# Patient Record
Sex: Female | Born: 1985 | Race: White | Hispanic: No | Marital: Single | Smoking: Never smoker
Health system: Southern US, Community
[De-identification: ages and names within clinical notes are randomized; demographics above are authoritative.]

## PROBLEM LIST (undated history)

## (undated) DIAGNOSIS — F32A Depression, unspecified: Secondary | ICD-10-CM

## (undated) DIAGNOSIS — J45909 Unspecified asthma, uncomplicated: Secondary | ICD-10-CM

## (undated) DIAGNOSIS — E079 Disorder of thyroid, unspecified: Secondary | ICD-10-CM

## (undated) DIAGNOSIS — F419 Anxiety disorder, unspecified: Secondary | ICD-10-CM

## (undated) DIAGNOSIS — F1910 Other psychoactive substance abuse, uncomplicated: Secondary | ICD-10-CM

## (undated) DIAGNOSIS — F329 Major depressive disorder, single episode, unspecified: Secondary | ICD-10-CM

## (undated) DIAGNOSIS — E039 Hypothyroidism, unspecified: Secondary | ICD-10-CM

## (undated) HISTORY — PX: NO PAST SURGERIES: SHX2092

---

## 2011-02-19 ENCOUNTER — Other Ambulatory Visit (HOSPITAL_COMMUNITY)
Admission: RE | Admit: 2011-02-19 | Discharge: 2011-02-19 | Disposition: A | Discharge status: Home / Self Care | Payer: BC Managed Care – PPO | Source: Ambulatory Visit | Attending: Obstetrics and Gynecology | Admitting: Obstetrics and Gynecology

## 2011-02-19 ENCOUNTER — Other Ambulatory Visit: Payer: Self-pay | Admitting: Obstetrics and Gynecology

## 2011-02-19 DIAGNOSIS — N63 Unspecified lump in unspecified breast: Secondary | ICD-10-CM

## 2011-02-19 DIAGNOSIS — N760 Acute vaginitis: Secondary | ICD-10-CM | POA: Insufficient documentation

## 2011-02-19 DIAGNOSIS — Z113 Encounter for screening for infections with a predominantly sexual mode of transmission: Secondary | ICD-10-CM | POA: Insufficient documentation

## 2011-02-19 DIAGNOSIS — Z01419 Encounter for gynecological examination (general) (routine) without abnormal findings: Secondary | ICD-10-CM | POA: Insufficient documentation

## 2011-02-26 ENCOUNTER — Other Ambulatory Visit: Payer: Self-pay | Admitting: Obstetrics and Gynecology

## 2011-02-26 ENCOUNTER — Ambulatory Visit
Admission: RE | Admit: 2011-02-26 | Discharge: 2011-02-26 | Disposition: A | Discharge status: Home / Self Care | Payer: BC Managed Care – PPO | Source: Ambulatory Visit | Attending: Obstetrics and Gynecology | Admitting: Obstetrics and Gynecology

## 2011-02-26 DIAGNOSIS — N63 Unspecified lump in unspecified breast: Secondary | ICD-10-CM

## 2012-03-21 ENCOUNTER — Other Ambulatory Visit (HOSPITAL_COMMUNITY)
Admission: RE | Admit: 2012-03-21 | Discharge: 2012-03-21 | Disposition: A | Discharge status: Home / Self Care | Payer: BC Managed Care – PPO | Source: Ambulatory Visit | Attending: Obstetrics and Gynecology | Admitting: Obstetrics and Gynecology

## 2012-03-21 DIAGNOSIS — Z01419 Encounter for gynecological examination (general) (routine) without abnormal findings: Secondary | ICD-10-CM | POA: Insufficient documentation

## 2012-03-21 DIAGNOSIS — Z113 Encounter for screening for infections with a predominantly sexual mode of transmission: Secondary | ICD-10-CM | POA: Insufficient documentation

## 2012-03-22 ENCOUNTER — Other Ambulatory Visit: Payer: Self-pay | Admitting: Obstetrics and Gynecology

## 2012-03-22 DIAGNOSIS — N6009 Solitary cyst of unspecified breast: Secondary | ICD-10-CM

## 2012-04-04 ENCOUNTER — Ambulatory Visit
Admission: RE | Admit: 2012-04-04 | Discharge: 2012-04-04 | Disposition: A | Discharge status: Home / Self Care | Payer: BC Managed Care – PPO | Source: Ambulatory Visit | Attending: Obstetrics and Gynecology | Admitting: Obstetrics and Gynecology

## 2012-04-04 DIAGNOSIS — N6009 Solitary cyst of unspecified breast: Secondary | ICD-10-CM

## 2013-05-04 ENCOUNTER — Other Ambulatory Visit: Payer: Self-pay | Admitting: Obstetrics and Gynecology

## 2013-05-04 DIAGNOSIS — N6009 Solitary cyst of unspecified breast: Secondary | ICD-10-CM

## 2013-05-11 ENCOUNTER — Other Ambulatory Visit: Payer: Self-pay

## 2014-01-01 ENCOUNTER — Ambulatory Visit: Payer: Self-pay | Admitting: Podiatry

## 2014-01-07 NOTE — Patient Instructions (Signed)

## 2014-01-08 ENCOUNTER — Encounter: Payer: Self-pay | Admitting: Podiatry

## 2014-01-08 ENCOUNTER — Ambulatory Visit (INDEPENDENT_AMBULATORY_CARE_PROVIDER_SITE_OTHER): Payer: BC Managed Care – PPO | Admitting: Podiatry

## 2014-01-08 ENCOUNTER — Ambulatory Visit (INDEPENDENT_AMBULATORY_CARE_PROVIDER_SITE_OTHER): Payer: BC Managed Care – PPO

## 2014-01-08 VITALS — BP 115/72 | HR 81 | Resp 16 | Ht 66.0 in | Wt 180.0 lb

## 2014-01-08 DIAGNOSIS — M722 Plantar fascial fibromatosis: Secondary | ICD-10-CM

## 2014-01-08 MED ORDER — MELOXICAM 15 MG PO TABS: 15.0000 mg | ORAL_TABLET | Freq: Every day | ORAL | Status: DC

## 2014-01-08 MED ORDER — METHYLPREDNISOLONE (PAK) 4 MG PO TABS: ORAL_TABLET | ORAL | Status: DC

## 2014-01-08 NOTE — Progress Notes (Signed)
   Subjective:    Patient ID: Misty DavidsonHeather Jensen, female    DOB: May 04, 1985, 28 y.o.   MRN: [id number]  HPI Comments: "I have pain in the heel and side of my foot"  Patient c/o aching plantar heel and lateral side left foot for about 6 months. She does have AM pain. She is an active runner. She has tried new shoes, OTC orthotics, massaging, essential oils, some Ibuprofen. Not helping.  Foot Pain      Review of Systems  All other systems reviewed and are negative.      Objective:   Physical Exam: I reviewed her past medical history medications allergies surgery social history and review of systems. Pulses are followed palpable bilateral. Neurologic sensorium is intact her symptoms once the monofilament. Deep tendon reflexes intact bilateral muscle strength is 5 over 5 dorsiflexion plantar flexors and inverters everters all intrinsic musculature appears to be intact. Orthopedic evaluation demonstrates is pain on palpation medially located tubercle of the left heel as well as pain at the fourth and fifth metatarsocuboid articulation. Cutaneous evaluation demonstrates supple well-hydrated cutis no erythema or edema cellulitis drainage or odor. Radiographic evaluation demonstrates plantar distally oriented calcaneal heel spur with pain on palpation medial calcaneal tubercle of the left heel. Soft tissue increasing density at the plantar fascial calcaneal insertion site is indicative of plantar fasciitis.        Assessment & Plan:  Assessment: Plantar fasciitis left. Lateral compensatory syndrome left.  Plan: Discussed etiology pathology conservative versus surgical therapies. At this point injected her left heel today with Kenalog and local anesthetic. Put her in a plantar fascial brace and a night splint. Dispensed a prescription for a Medrol Dosepak to be followed by meloxicam. Discussed appropriate shoe gear stretching exercises ice therapy and shoe gear modifications.

## 2014-02-05 ENCOUNTER — Encounter: Payer: Self-pay | Admitting: Podiatry

## 2014-02-05 ENCOUNTER — Ambulatory Visit (INDEPENDENT_AMBULATORY_CARE_PROVIDER_SITE_OTHER): Payer: BC Managed Care – PPO | Admitting: Podiatry

## 2014-02-05 VITALS — BP 113/78 | HR 86 | Resp 16

## 2014-02-05 DIAGNOSIS — M722 Plantar fascial fibromatosis: Secondary | ICD-10-CM

## 2014-02-05 NOTE — Progress Notes (Signed)
She presents today for follow-up of plantar fasciitis to her left heel.  Objective: Vital signs are stable she is alert and oriented 3. Pulses are palpable left. She has pain on palpation medially located tubercle of the left heel.  Assessment: Plantar fasciitis left foot. Resolving at 50%.  Plan: Reinjected the left heel today continue all other conservative therapies follow up with me in 1 month we'll consider orthotics at that time.

## 2014-03-05 ENCOUNTER — Ambulatory Visit: Payer: BC Managed Care – PPO | Admitting: Podiatry

## 2014-06-25 ENCOUNTER — Other Ambulatory Visit (HOSPITAL_COMMUNITY)
Admission: RE | Admit: 2014-06-25 | Discharge: 2014-06-25 | Disposition: A | Discharge status: Home / Self Care | Payer: BLUE CROSS/BLUE SHIELD | Source: Ambulatory Visit | Attending: Obstetrics and Gynecology | Admitting: Obstetrics and Gynecology

## 2014-06-25 ENCOUNTER — Other Ambulatory Visit: Payer: Self-pay | Admitting: Obstetrics and Gynecology

## 2014-06-25 DIAGNOSIS — Z01411 Encounter for gynecological examination (general) (routine) with abnormal findings: Secondary | ICD-10-CM | POA: Insufficient documentation

## 2014-06-25 DIAGNOSIS — Z1151 Encounter for screening for human papillomavirus (HPV): Secondary | ICD-10-CM | POA: Insufficient documentation

## 2014-06-26 ENCOUNTER — Emergency Department (INDEPENDENT_AMBULATORY_CARE_PROVIDER_SITE_OTHER)
Admission: EM | Admit: 2014-06-26 | Discharge: 2014-06-26 | Disposition: A | Discharge status: Home / Self Care | Payer: Self-pay | Source: Home / Self Care | Attending: Emergency Medicine | Admitting: Emergency Medicine

## 2014-06-26 ENCOUNTER — Encounter (HOSPITAL_COMMUNITY): Payer: Self-pay | Admitting: Emergency Medicine

## 2014-06-26 DIAGNOSIS — N200 Calculus of kidney: Secondary | ICD-10-CM

## 2014-06-26 LAB — POCT URINALYSIS DIP (DEVICE)
BILIRUBIN URINE: NEGATIVE
Glucose, UA: NEGATIVE mg/dL
Ketones, ur: NEGATIVE mg/dL
NITRITE: NEGATIVE
PH: 6 (ref 5.0–8.0)
Protein, ur: 30 mg/dL — AB
Specific Gravity, Urine: 1.025 (ref 1.005–1.030)
Urobilinogen, UA: 0.2 mg/dL (ref 0.0–1.0)

## 2014-06-26 LAB — POCT PREGNANCY, URINE: Preg Test, Ur: NEGATIVE

## 2014-06-26 MED ORDER — HYDROCODONE-ACETAMINOPHEN 5-325 MG PO TABS
1.0000 | ORAL_TABLET | Freq: Once | ORAL | Status: AC
  Administered 2014-06-26: 1 via ORAL

## 2014-06-26 MED ORDER — IBUPROFEN 800 MG PO TABS
800.0000 mg | ORAL_TABLET | Freq: Three times a day (TID) | ORAL | Status: DC
Start: 2014-06-26 — End: 2016-06-12

## 2014-06-26 MED ORDER — HYDROCODONE-ACETAMINOPHEN 5-325 MG PO TABS
ORAL_TABLET | ORAL | Status: AC
  Filled 2014-06-26: qty 1

## 2014-06-26 MED ORDER — KETOROLAC TROMETHAMINE 60 MG/2ML IM SOLN
INTRAMUSCULAR | Status: AC
  Filled 2014-06-26: qty 2

## 2014-06-26 MED ORDER — HYDROCODONE-ACETAMINOPHEN 5-325 MG PO TABS
1.0000 | ORAL_TABLET | Freq: Four times a day (QID) | ORAL | Status: DC | PRN
Start: 2014-06-26 — End: 2016-06-12

## 2014-06-26 MED ORDER — KETOROLAC TROMETHAMINE 60 MG/2ML IM SOLN
60.0000 mg | Freq: Once | INTRAMUSCULAR | Status: AC
  Administered 2014-06-26: 60 mg via INTRAMUSCULAR

## 2014-06-26 MED ORDER — TAMSULOSIN HCL 0.4 MG PO CAPS: 0.4000 mg | ORAL_CAPSULE | Freq: Every day | ORAL | Status: DC

## 2014-06-26 MED ORDER — ONDANSETRON HCL 4 MG PO TABS: 4.0000 mg | ORAL_TABLET | Freq: Three times a day (TID) | ORAL | Status: DC | PRN

## 2014-06-26 NOTE — Discharge Instructions (Signed)
I think you have a kidney stone. Take ibuprofen 3 times a day for pain. Use the Norco every 4-6 hours as needed for severe pain. Take Zofran every 8 hours as needed for nausea. Take Flomax daily for the next week or until the stone has passed. Please strain your urine to try and collect the stone. Drink lots of fluids. If you cannot control the pain at home, you have uncontrolled vomiting, or you develop a fever [facility].

## 2014-06-26 NOTE — ED Notes (Signed)
Pt is having RLQ pain for about 24 hours.  Pt is very guarded with her abdomen, she is teary eyed, and is breathing heavy.  She states she is 7/10 pain, but has a high tolerance for pain.

## 2014-06-26 NOTE — ED Provider Notes (Signed)
CSN: [id number]     Arrival date & time 06/26/14  6213 History   First MD Initiated Contact with Patient 06/26/14 0827     Chief Complaint  Patient presents with  . Abdominal Pain   (Consider location/radiation/quality/duration/timing/severity/associated sxs/prior Treatment) HPI  He is a 29 year old woman here for evaluation of abdominal pain. She states this started yesterday. It is located in her right flank area. It does not radiate.  She states it is very hard for her to get comfortable. It is associated with nausea, but no vomiting. She states she had hot and cold chills last night, but did not check a temperature. She had a physical at her OB/GYN yesterday and was put on Cipro for a possible bladder infection as she had blood in her urine. She took some ibuprofen yesterday with minimal improvement. No medications this morning.  History reviewed. No pertinent past medical history. History reviewed. No pertinent past surgical history. History reviewed. No pertinent family history. History  Substance Use Topics  . Smoking status: Never Smoker   . Smokeless tobacco: Never Used  . Alcohol Use: Yes     Comment: occasional   OB History    No data available     Review of Systems  Constitutional: Positive for fever (subjective) and chills.  HENT: Negative.   Respiratory: Negative.   Cardiovascular: Negative.   Gastrointestinal: Positive for nausea, abdominal pain and diarrhea. Negative for vomiting.    Allergies  Codeine  Home Medications   Prior to Admission medications   Medication Sig Start Date End Date Taking? Authorizing Provider  Cetirizine HCl (ZYRTEC PO) Take by mouth.   Yes Historical Provider, MD  drospirenone-ethinyl estradiol (YAZ,GIANVI,LORYNA) 3-0.02 MG tablet Take 1 tablet by mouth daily.   Yes Historical Provider, MD  Escitalopram Oxalate (LEXAPRO PO) Take by mouth.   Yes Historical Provider, MD  Lansoprazole (PREVACID PO) Take by mouth.   Yes Historical  Provider, MD  methylPREDNIsolone (MEDROL DOSPACK) 4 MG tablet follow package directions 01/08/14  Yes Max T Hyatt, DPM  valACYclovir (VALTREX) 1000 MG tablet Take 1,000 mg by mouth 2 (two) times daily.   Yes Historical Provider, MD  HYDROcodone-acetaminophen (NORCO) 5-325 MG per tablet Take 1 tablet by mouth every 6 (six) hours as needed for moderate pain. 06/26/14   Charm Rings, MD  ibuprofen (ADVIL,MOTRIN) 800 MG tablet Take 1 tablet (800 mg total) by mouth 3 (three) times daily. 06/26/14   Charm Rings, MD  ondansetron (ZOFRAN) 4 MG tablet Take 1 tablet (4 mg total) by mouth every 8 (eight) hours as needed for nausea or vomiting. 06/26/14   Charm Rings, MD  tamsulosin (FLOMAX) 0.4 MG CAPS capsule Take 1 capsule (0.4 mg total) by mouth daily. 06/26/14   Charm Rings, MD  Triamcinolone Acetonide (NASACORT AQ NA) Place into the nose.    Historical Provider, MD   BP 125/82 mmHg  Pulse 110  Temp(Src) 98.2 F (36.8 C) (Oral)  Resp 22  SpO2 100%  LMP 05/26/2014 (Approximate) Physical Exam  Constitutional: She is oriented to person, place, and time. She appears well-developed and well-nourished. She appears distressed (Looks uncomfortable).  Neck: Neck supple.  Cardiovascular: Regular rhythm and normal heart sounds.  Tachycardia present.   Pulmonary/Chest: Effort normal and breath sounds normal. No respiratory distress. She has no wheezes. She has no rales.  Abdominal: Soft. Bowel sounds are normal. She exhibits no distension. There is tenderness (right side). There is rebound (pain with heel drop). There  is no guarding and no tenderness at McBurney's point.  Positive for right CVA tenderness  Neurological: She is alert and oriented to person, place, and time.    ED Course  Procedures (including critical care time) Labs Review Labs Reviewed  POCT URINALYSIS DIP (DEVICE) - Abnormal; Notable for the following:    Hgb urine dipstick TRACE (*)    Protein, ur 30 (*)    Leukocytes, UA SMALL (*)     All other components within normal limits  POCT PREGNANCY, URINE    Imaging Review No results found.   MDM   1. Right kidney stone    Toradol 60 mg IM and Norco 5-325 milligrams by mouth given.  Differential does include appendicitis, however no fever currently and pain would be an unusual location. Presentation is more consistent with kidney stone. Will treat with ibuprofen, Norco, Zofran, Flomax.  Urine strainer given. Return precautions reviewed.    Charm RingsErin J Aprille Sawhney, MD 06/26/14 [phone]

## 2014-06-27 LAB — CYTOLOGY - PAP

## 2014-07-08 ENCOUNTER — Other Ambulatory Visit: Payer: Self-pay | Admitting: Nurse Practitioner

## 2014-07-08 ENCOUNTER — Ambulatory Visit
Admission: RE | Admit: 2014-07-08 | Discharge: 2014-07-08 | Disposition: A | Discharge status: Home / Self Care | Payer: BLUE CROSS/BLUE SHIELD | Source: Ambulatory Visit | Attending: Nurse Practitioner | Admitting: Nurse Practitioner

## 2014-07-08 DIAGNOSIS — R1084 Generalized abdominal pain: Secondary | ICD-10-CM

## 2015-03-26 ENCOUNTER — Encounter (HOSPITAL_COMMUNITY): Payer: Self-pay | Admitting: Emergency Medicine

## 2015-03-26 ENCOUNTER — Emergency Department (HOSPITAL_COMMUNITY): Payer: BLUE CROSS/BLUE SHIELD

## 2015-03-26 ENCOUNTER — Emergency Department (HOSPITAL_COMMUNITY)
Admission: EM | Admit: 2015-03-26 | Discharge: 2015-03-26 | Disposition: A | Discharge status: Home / Self Care | Payer: BLUE CROSS/BLUE SHIELD | Attending: Emergency Medicine | Admitting: Emergency Medicine

## 2015-03-26 DIAGNOSIS — Z791 Long term (current) use of non-steroidal anti-inflammatories (NSAID): Secondary | ICD-10-CM | POA: Insufficient documentation

## 2015-03-26 DIAGNOSIS — Z79899 Other long term (current) drug therapy: Secondary | ICD-10-CM | POA: Insufficient documentation

## 2015-03-26 DIAGNOSIS — Z8639 Personal history of other endocrine, nutritional and metabolic disease: Secondary | ICD-10-CM | POA: Insufficient documentation

## 2015-03-26 DIAGNOSIS — Z7952 Long term (current) use of systemic steroids: Secondary | ICD-10-CM | POA: Insufficient documentation

## 2015-03-26 DIAGNOSIS — Z793 Long term (current) use of hormonal contraceptives: Secondary | ICD-10-CM | POA: Insufficient documentation

## 2015-03-26 DIAGNOSIS — R6889 Other general symptoms and signs: Secondary | ICD-10-CM

## 2015-03-26 DIAGNOSIS — R000 Tachycardia, unspecified: Secondary | ICD-10-CM | POA: Insufficient documentation

## 2015-03-26 DIAGNOSIS — M791 Myalgia: Secondary | ICD-10-CM | POA: Insufficient documentation

## 2015-03-26 DIAGNOSIS — R062 Wheezing: Secondary | ICD-10-CM | POA: Insufficient documentation

## 2015-03-26 DIAGNOSIS — R509 Fever, unspecified: Secondary | ICD-10-CM | POA: Insufficient documentation

## 2015-03-26 DIAGNOSIS — R0981 Nasal congestion: Secondary | ICD-10-CM | POA: Insufficient documentation

## 2015-03-26 HISTORY — DX: Disorder of thyroid, unspecified: E07.9

## 2015-03-26 MED ORDER — BENZONATATE 100 MG PO CAPS: 100.0000 mg | ORAL_CAPSULE | Freq: Three times a day (TID) | ORAL | Status: DC

## 2015-03-26 MED ORDER — HYDROCOD POLST-CPM POLST ER 10-8 MG/5ML PO SUER: 5.0000 mL | Freq: Every evening | ORAL | Status: DC | PRN

## 2015-03-26 MED ORDER — ALBUTEROL SULFATE HFA 108 (90 BASE) MCG/ACT IN AERS
2.0000 | INHALATION_SPRAY | Freq: Once | RESPIRATORY_TRACT | Status: AC
  Administered 2015-03-26: 2 via RESPIRATORY_TRACT
  Filled 2015-03-26: qty 6.7

## 2015-03-26 NOTE — Discharge Instructions (Signed)
Your symptoms are likely viral in nature and will resolve on their own over the next week. You may continue taking OTC medications for your symptoms. You may also try Sudafed to help with her congestion. Please take your cough medicine at night before bed as it does contain pain medicine they can make him very drowsy. Follow-up with your doctor as needed return to ED for any new or worsening symptoms.

## 2015-03-26 NOTE — ED Provider Notes (Signed)
CSN: [id number]     Arrival date & time 03/26/15  1729 History  By signing my name below, I, Evon Slackerrance Branch, attest that this documentation has been prepared under the direction and in the presence of [employer]jamin Tarl Cephas, PA-C. Electronically Signed: Evon Slackerrance Branch, ED Scribe. 03/26/2015. 7:07 PM.    Chief Complaint  Patient presents with  . Cough  . Generalized Body Aches    The history is provided by the patient. No language interpreter was used.   HPI Comments: Misty Jensen is a 30 y.o. female who presents to the Emergency Department complaining of fever onset 2 days prior. Pt states that she has associated congestion, productive cough, and generalized myalgias. Pt states that she has tried tylenol with no relief. Pt states she has temporary relief with ibuprofen. Pt states that she has as also tried dayquil, Nyquil, and delsym with no relief. Pt denies receiving influenza vaccination. Pt denies leg swelling or other related symptoms. Denies recent travel, surgeries, hemoptysis or leg swelling. Pt denies H Xof PE or DVT.    Past Medical History  Diagnosis Date  . Thyroid disease    History reviewed. No pertinent past surgical history. No family history on file. Social History  Substance Use Topics  . Smoking status: Never Smoker   . Smokeless tobacco: Never Used  . Alcohol Use: Yes     Comment: occasional   OB History    No data available     Review of Systems A complete 10 system review of systems was obtained and all systems are negative except as noted in the HPI and PMH.     Allergies  Sulfa antibiotics and Codeine  Home Medications   Prior to Admission medications   Medication Sig Start Date End Date Taking? Authorizing Provider  benzonatate (TESSALON) 100 MG capsule Take 1 capsule (100 mg total) by mouth every 8 (eight) hours. 03/26/15   Joycie PeekBenjamin Raniyah Curenton, PA-C  Cetirizine HCl (ZYRTEC PO) Take by mouth.    Historical Provider, MD  drospirenone-ethinyl  estradiol (YAZ,GIANVI,LORYNA) 3-0.02 MG tablet Take 1 tablet by mouth daily.    Historical Provider, MD  Escitalopram Oxalate (LEXAPRO PO) Take by mouth.    Historical Provider, MD  HYDROcodone-acetaminophen (NORCO) 5-325 MG per tablet Take 1 tablet by mouth every 6 (six) hours as needed for moderate pain. 06/26/14   Charm RingsErin J Honig, MD  ibuprofen (ADVIL,MOTRIN) 800 MG tablet Take 1 tablet (800 mg total) by mouth 3 (three) times daily. 06/26/14   Charm RingsErin J Honig, MD  Lansoprazole (PREVACID PO) Take by mouth.    Historical Provider, MD  methylPREDNIsolone (MEDROL DOSPACK) 4 MG tablet follow package directions 01/08/14   Max T Hyatt, DPM  ondansetron (ZOFRAN) 4 MG tablet Take 1 tablet (4 mg total) by mouth every 8 (eight) hours as needed for nausea or vomiting. 06/26/14   Charm RingsErin J Honig, MD  tamsulosin (FLOMAX) 0.4 MG CAPS capsule Take 1 capsule (0.4 mg total) by mouth daily. 06/26/14   Charm RingsErin J Honig, MD  Triamcinolone Acetonide (NASACORT AQ NA) Place into the nose.    Historical Provider, MD  valACYclovir (VALTREX) 1000 MG tablet Take 1,000 mg by mouth 2 (two) times daily.    Historical Provider, MD   BP 119/99 mmHg  Pulse 109  Temp(Src) 98 F (36.7 C) (Oral)  Resp 16  Ht 5\' 6"  (1.676 m)  Wt 72.576 kg  BMI 25.84 kg/m2  SpO2 97%  LMP 03/26/2015   Physical Exam  Constitutional: She is oriented to  person, place, and time. She appears well-developed and well-nourished. No distress.  HENT:  Head: Normocephalic and atraumatic.  Mouth/Throat: Oropharynx is clear and moist.  Eyes: Conjunctivae and EOM are normal.  Neck: Normal range of motion. Neck supple. No tracheal deviation present.  No meningismus or nuchal rigidity  Cardiovascular: Normal heart sounds.  Tachycardia present.   Mild tachycardia with normal heart sounds.   Pulmonary/Chest: Effort normal. No respiratory distress.  Mild wheezing LLL. No other adventitious lung sounds  Musculoskeletal: Normal range of motion. She exhibits no edema.  No  unilateral leg swelling  Neurological: She is alert and oriented to person, place, and time.  Skin: Skin is warm and dry.  Psychiatric: She has a normal mood and affect. Her behavior is normal.  Nursing note and vitals reviewed.   ED Course  Procedures (including critical care time) DIAGNOSTIC STUDIES: Oxygen Saturation is 97% on RA, normal by my interpretation.    COORDINATION OF CARE: 6:17 PM-Discussed treatment plan with pt at bedside and pt agreed to plan.    Labs Review Labs Reviewed - No data to display  Imaging Review Dg Chest 2 View  03/26/2015  CLINICAL DATA:  Cough since Monday.  Chills and fever. EXAM: CHEST  2 VIEW COMPARISON:  None. FINDINGS: The cardiac silhouette, mediastinal and hilar contours are within normal limits. Mild peribronchial thickening and slight increased interstitial markings which could suggest bronchitis. No infiltrates or effusions. The bony thorax is intact. IMPRESSION: Changes suggesting bronchitis.  No focal infiltrates. Electronically Signed   By: Rudie Meyer M.D.   On: 03/26/2015 18:31       EKG Interpretation None     Meds given in ED:  Medications  albuterol (PROVENTIL HFA;VENTOLIN HFA) 108 (90 Base) MCG/ACT inhaler 2 puff (2 puffs Inhalation Given 03/26/15 1903)    Discharge Medication List as of 03/26/2015  6:42 PM    START taking these medications   Details  benzonatate (TESSALON) 100 MG capsule Take 1 capsule (100 mg total) by mouth every 8 (eight) hours., Starting 03/26/2015, Until Discontinued, Print    chlorpheniramine-HYDROcodone (TUSSIONEX PENNKINETIC ER) 10-8 MG/5ML SUER Take 5 mLs by mouth at bedtime as needed for cough., Starting 03/26/2015, Until Discontinued, Print       Filed Vitals:   03/26/15 1740  BP: 119/99  Pulse: 109  Temp: 98 F (36.7 C)  TempSrc: Oral  Resp: 16  Height: 5\' 6"  (1.676 m)  Weight: 72.576 kg  SpO2: 97%    MDM  Patient with symptoms consistent with influenza.  Vitals are stable, mild  tachycardia.  No signs of dehydration, tolerating PO's.  Lungs are clear. Due to patient's presentation and physical exam a chest x-ray was ordered to rule out pneumonia.  Chest x-ray shows mild bronchitic changes. Patient given inhaler. Discussed the cost versus benefit of Tamiflu treatment with the patient.  The patient understands that symptoms are greater than the recommended 24-48 hour window of treatment.  Patient will be discharged with instructions to orally hydrate, rest, and use over-the-counter medications such as anti-inflammatories ibuprofen and Aleve for muscle aches and Tylenol for fever.  Patient will also be given a cough suppressant. Low suspicion at this time for other acute or emergent pathology such as PE, patient has low Wells score. Patient stable for discharge.  Final diagnoses:  Influenza-like symptoms      I personally performed the services described in this documentation, which was scribed in my presence. The recorded information has been reviewed and is  accurate.      Joycie Peek, PA-C 03/26/15 1907  Doug Sou, MD 03/27/15 [phone]

## 2015-03-26 NOTE — ED Notes (Signed)
Declined W/C at D/C and was escorted to lobby by RN. 

## 2015-03-26 NOTE — ED Notes (Signed)
See Pa Assessment

## 2015-03-26 NOTE — ED Notes (Signed)
Pt states for the last 3 days has been having a productive cough with body aches and chills. Denies and n/v/d.

## 2016-02-10 IMAGING — CT CT ABD-PELV W/O CM
2 of 4 series · 16 of 46 positions shown, 18 images · non-contrast
Comparison: None

CLINICAL DATA: Right flank pain for 3 weeks

EXAM:
CT ABDOMEN AND PELVIS WITHOUT CONTRAST
TECHNIQUE: Multidetector CT imaging of the abdomen and pelvis was performed
following the standard protocol without IV contrast.

[Series 2: renal stone 5.0 i41s 3 · axial · 0.76mm/px · z∈[-454,-34]mm · 13 of 92 slices shown, 15 images]
[im 4/92  soft-tissue]
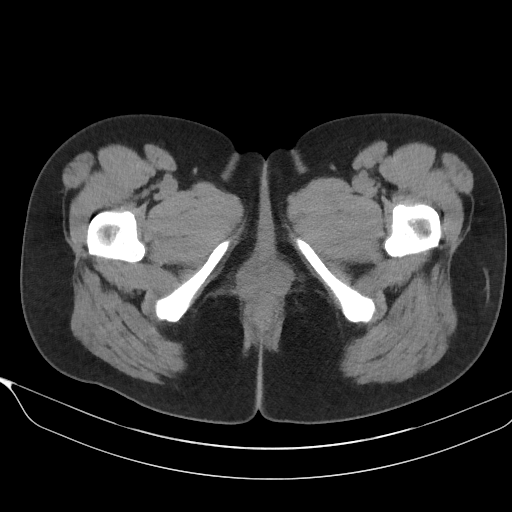
[im 4/92  bone]
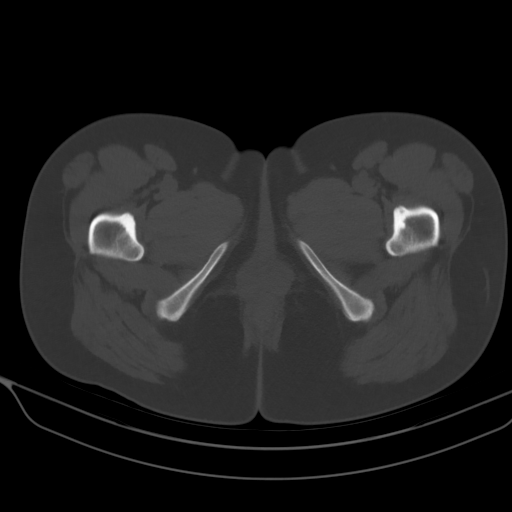
[im 11/92  soft-tissue]
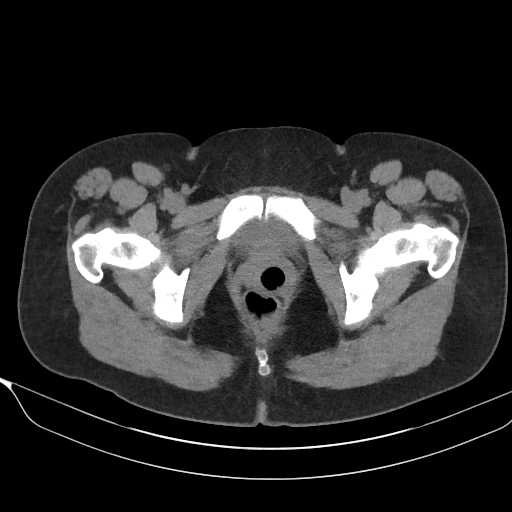
[im 18/92  soft-tissue]
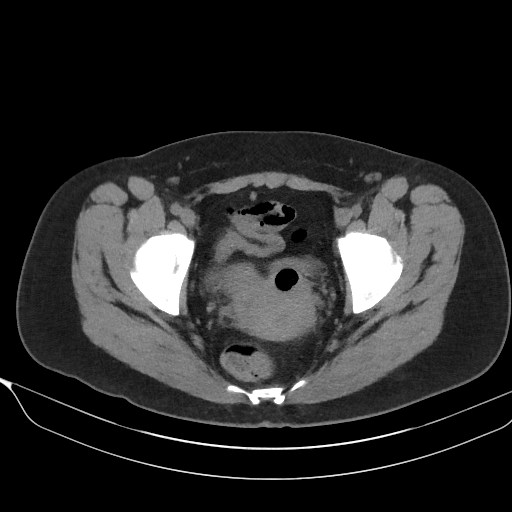
[im 25/92  soft-tissue]
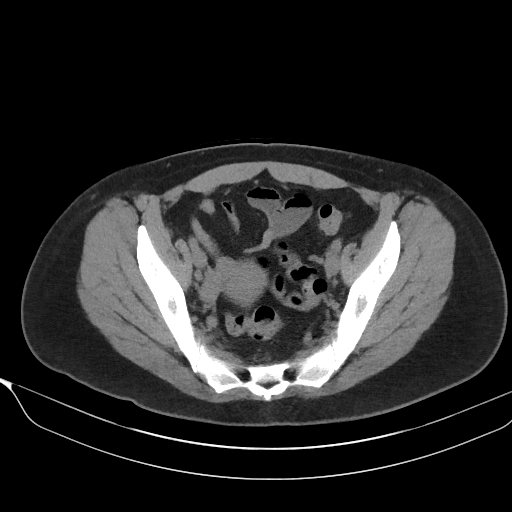
[im 32/92  soft-tissue]
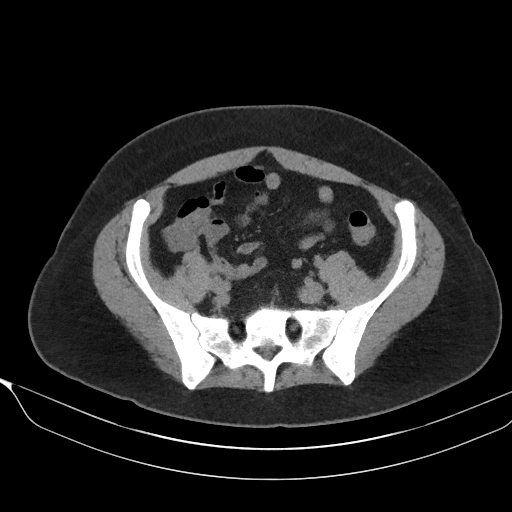
[im 39/92  soft-tissue]
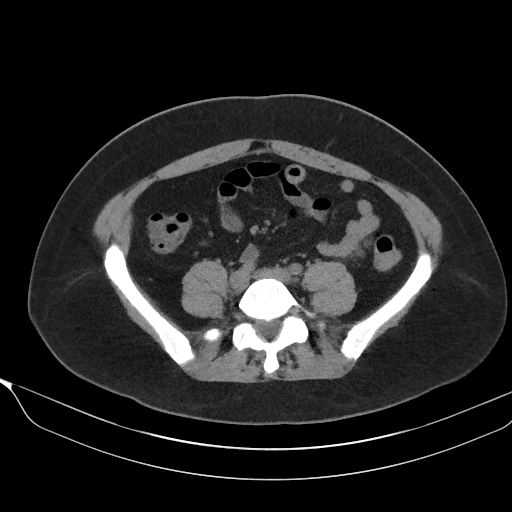
[im 46/92  soft-tissue]
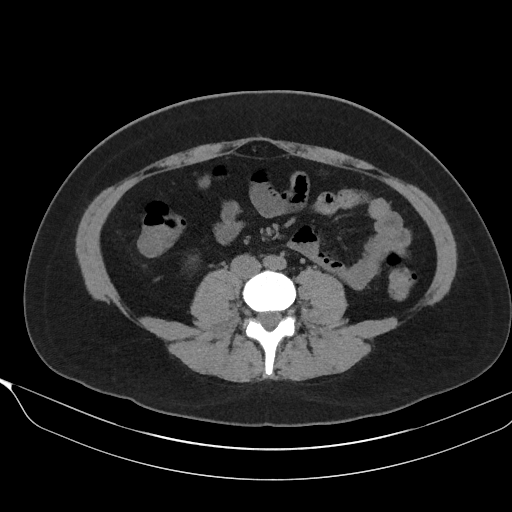
[im 53/92  soft-tissue]
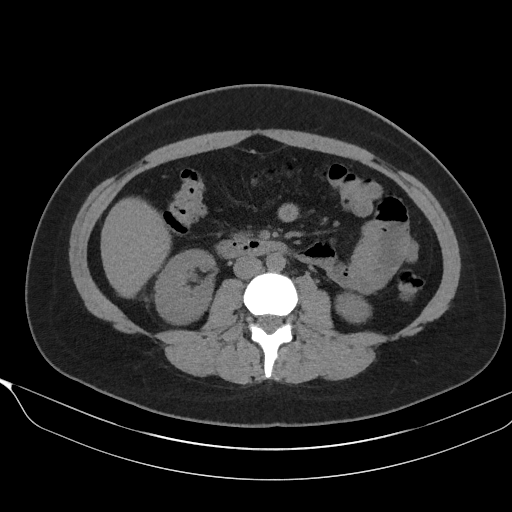
[im 60/92  soft-tissue]
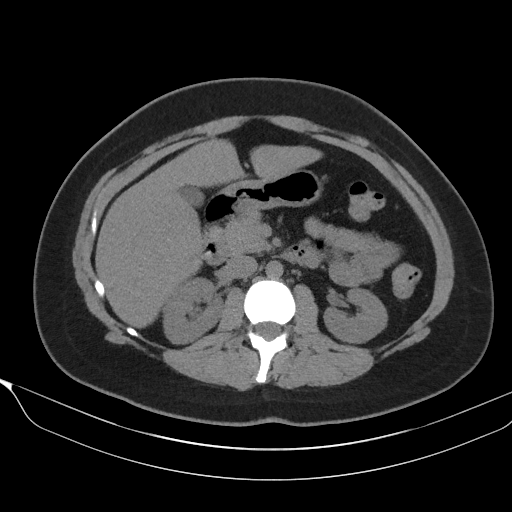
[im 60/92  bone]
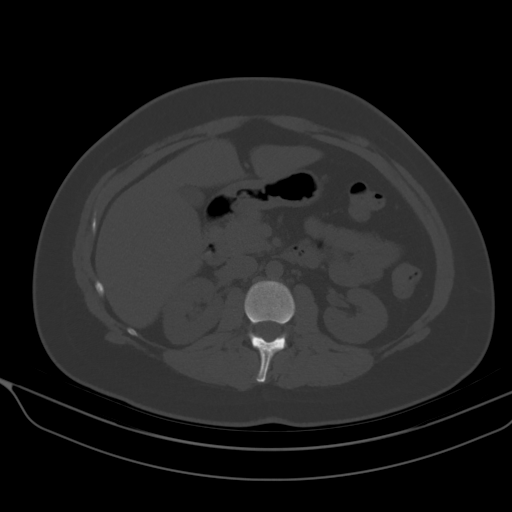
[im 67/92  soft-tissue]
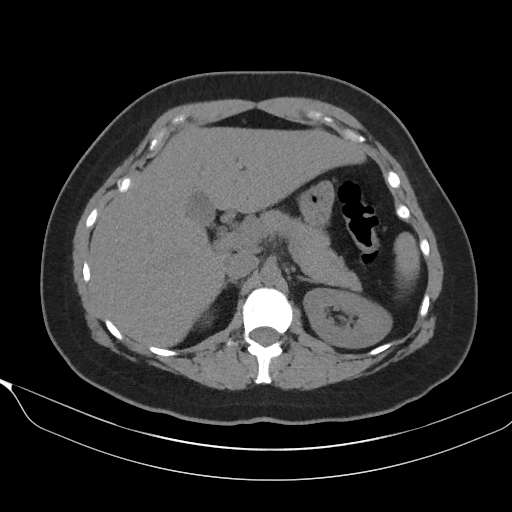
[im 74/92  soft-tissue]
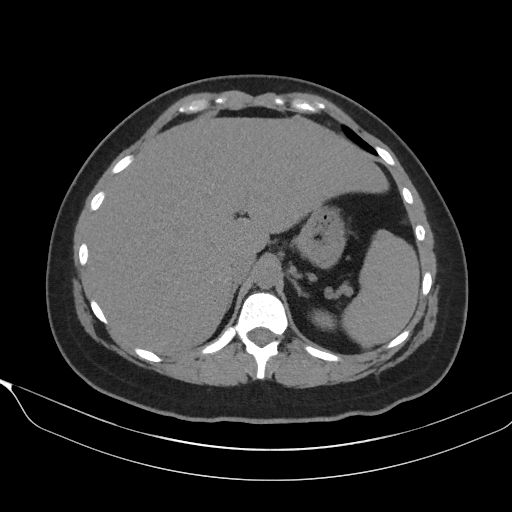
[im 81/92  soft-tissue]
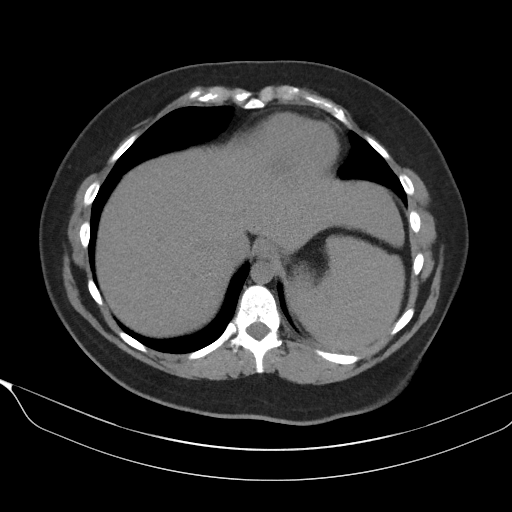
[im 88/92  soft-tissue]
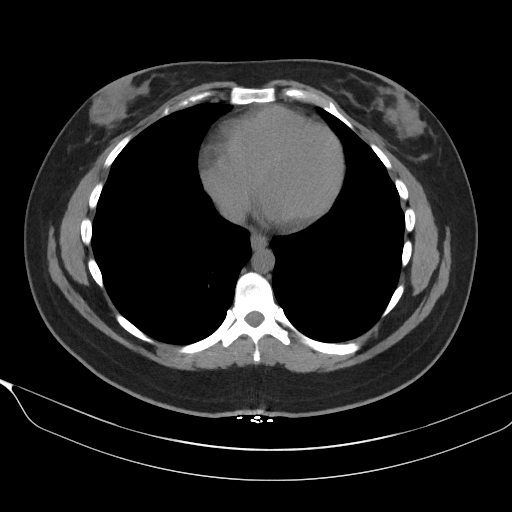

[Series 3: abd pelvis wo 2.0 cor · coronal · 0.71mm/px · 3 of 125 slices shown]
[im 42/125  soft-tissue]
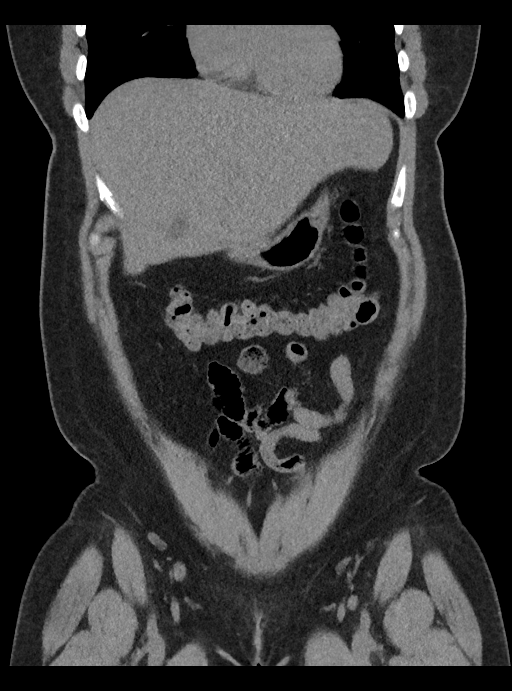
[im 56/125  soft-tissue]
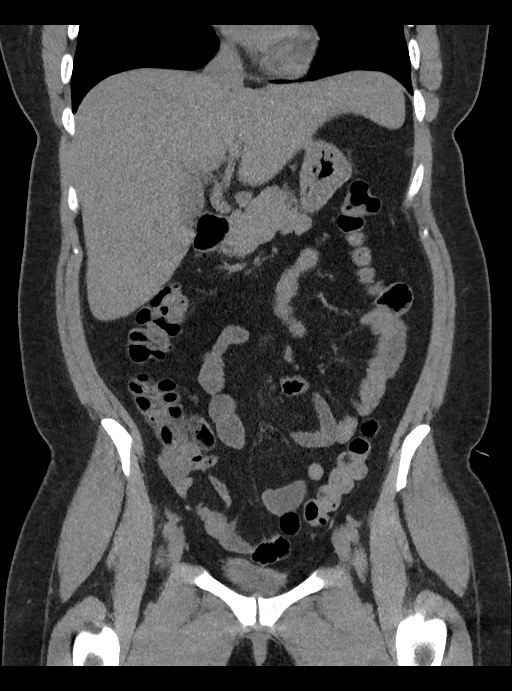
[im 69/125  soft-tissue]
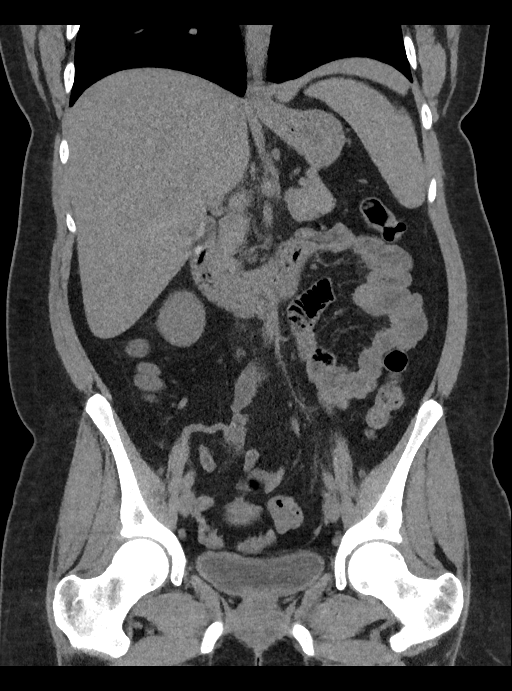

[16 of 46 positions shown; findings below may reference images not displayed]

FINDINGS: Lower chest: No pleural effusion. No pericardial effusion. The lung
bases appear clear.

Hepatobiliary: No suspicious liver abnormalities identified. The
gallbladder appears normal. There is no biliary dilatation.

Pancreas: Negative

Spleen: Negative

Adrenals/Urinary Tract: Normal adrenal glands. There are a few tiny
punctate stones identified within the right renal collecting system,
image number 93/series 3. Punctate stone within the inferior pole
collecting system of the left kidney is also noted, image 92/series
3. There is no hydronephrosis or hydroureter. No ureteral calculi
identified. Tiny calcification within the left posterior bladder
base is noted, image 79/series 2. This measures less than 3 mm.

Stomach/Bowel: The stomach is within normal limits. The small bowel
loops have a normal course and caliber. No obstruction. Normal
appearance of the colon.

Vascular/Lymphatic: Normal appearance of the abdominal aorta. No
enlarged retroperitoneal or mesenteric adenopathy. No enlarged
pelvic or inguinal lymph nodes.

Reproductive: The uterus and adnexal structures are unremarkable.

Other: There is no ascites or focal fluid collections within the
abdomen or pelvis.

Musculoskeletal: Review of the visualized osseous structures is
negative for aggressive lytic or sclerotic bone lesions.
IMPRESSION: 1. Tiny bilateral punctate renal calculi.
2. Tiny stone within the left posterior bladder base measures less
than 3 mm and may have recently passed accounting for the patient's
right flank pain.

## 2016-06-12 ENCOUNTER — Encounter (HOSPITAL_COMMUNITY): Payer: Self-pay

## 2016-06-12 ENCOUNTER — Encounter (HOSPITAL_BASED_OUTPATIENT_CLINIC_OR_DEPARTMENT_OTHER): Payer: Self-pay | Admitting: Emergency Medicine

## 2016-06-12 ENCOUNTER — Emergency Department (HOSPITAL_BASED_OUTPATIENT_CLINIC_OR_DEPARTMENT_OTHER)
Admission: EM | Admit: 2016-06-12 | Discharge: 2016-06-12 | Disposition: A | Discharge status: Other Acute Inpatient Hospital | Payer: BLUE CROSS/BLUE SHIELD | Attending: Emergency Medicine | Admitting: Emergency Medicine

## 2016-06-12 ENCOUNTER — Inpatient Hospital Stay (HOSPITAL_COMMUNITY)
Admission: AD | Admit: 2016-06-12 | Discharge: 2016-06-18 | DRG: 885 | Disposition: A | Discharge status: Other Acute Inpatient Hospital | Payer: No Typology Code available for payment source | Source: Intra-hospital | Attending: Psychiatry | Admitting: Psychiatry

## 2016-06-12 DIAGNOSIS — Z560 Unemployment, unspecified: Secondary | ICD-10-CM

## 2016-06-12 DIAGNOSIS — Z5181 Encounter for therapeutic drug level monitoring: Secondary | ICD-10-CM | POA: Insufficient documentation

## 2016-06-12 DIAGNOSIS — Z882 Allergy status to sulfonamides status: Secondary | ICD-10-CM

## 2016-06-12 DIAGNOSIS — G4700 Insomnia, unspecified: Secondary | ICD-10-CM | POA: Diagnosis present

## 2016-06-12 DIAGNOSIS — F1490 Cocaine use, unspecified, uncomplicated: Secondary | ICD-10-CM | POA: Insufficient documentation

## 2016-06-12 DIAGNOSIS — F332 Major depressive disorder, recurrent severe without psychotic features: Principal | ICD-10-CM | POA: Diagnosis present

## 2016-06-12 DIAGNOSIS — F1420 Cocaine dependence, uncomplicated: Secondary | ICD-10-CM | POA: Diagnosis present

## 2016-06-12 DIAGNOSIS — F1910 Other psychoactive substance abuse, uncomplicated: Secondary | ICD-10-CM

## 2016-06-12 DIAGNOSIS — F1994 Other psychoactive substance use, unspecified with psychoactive substance-induced mood disorder: Secondary | ICD-10-CM | POA: Diagnosis not present

## 2016-06-12 DIAGNOSIS — Z886 Allergy status to analgesic agent status: Secondary | ICD-10-CM

## 2016-06-12 DIAGNOSIS — E039 Hypothyroidism, unspecified: Secondary | ICD-10-CM | POA: Diagnosis present

## 2016-06-12 DIAGNOSIS — J45909 Unspecified asthma, uncomplicated: Secondary | ICD-10-CM | POA: Insufficient documentation

## 2016-06-12 DIAGNOSIS — R45851 Suicidal ideations: Secondary | ICD-10-CM | POA: Diagnosis present

## 2016-06-12 DIAGNOSIS — F1290 Cannabis use, unspecified, uncomplicated: Secondary | ICD-10-CM | POA: Insufficient documentation

## 2016-06-12 DIAGNOSIS — Z79899 Other long term (current) drug therapy: Secondary | ICD-10-CM | POA: Diagnosis not present

## 2016-06-12 HISTORY — DX: Depression, unspecified: F32.A

## 2016-06-12 HISTORY — DX: Other psychoactive substance abuse, uncomplicated: F19.10

## 2016-06-12 HISTORY — DX: Hypothyroidism, unspecified: E03.9

## 2016-06-12 HISTORY — DX: Major depressive disorder, single episode, unspecified: F32.9

## 2016-06-12 HISTORY — DX: Anxiety disorder, unspecified: F41.9

## 2016-06-12 HISTORY — DX: Unspecified asthma, uncomplicated: J45.909

## 2016-06-12 LAB — COMPREHENSIVE METABOLIC PANEL
ALT: 23 U/L (ref 14–54)
AST: 24 U/L (ref 15–41)
Albumin: 4.5 g/dL (ref 3.5–5.0)
Alkaline Phosphatase: 62 U/L (ref 38–126)
Anion gap: 11 (ref 5–15)
BILIRUBIN TOTAL: 1 mg/dL (ref 0.3–1.2)
BUN: 9 mg/dL (ref 6–20)
CALCIUM: 9.7 mg/dL (ref 8.9–10.3)
CO2: 23 mmol/L (ref 22–32)
CREATININE: 0.87 mg/dL (ref 0.44–1.00)
Chloride: 105 mmol/L (ref 101–111)
GFR calc Af Amer: >60 mL/min (ref >60)
GFR calc non Af Amer: >60 mL/min (ref >60)
GLUCOSE: 85 mg/dL (ref 65–99)
Potassium: 3.5 mmol/L (ref 3.5–5.1)
SODIUM: 139 mmol/L (ref 135–145)
TOTAL PROTEIN: 7.8 g/dL (ref 6.5–8.1)

## 2016-06-12 LAB — HCG, QUANTITATIVE, PREGNANCY

## 2016-06-12 LAB — RAPID URINE DRUG SCREEN, HOSP PERFORMED
Amphetamines: POSITIVE — AB
BARBITURATES: NOT DETECTED
Benzodiazepines: POSITIVE — AB
COCAINE: POSITIVE — AB
Opiates: NOT DETECTED
Tetrahydrocannabinol: POSITIVE — AB

## 2016-06-12 LAB — ACETAMINOPHEN LEVEL

## 2016-06-12 LAB — CBC
HEMATOCRIT: 39.7 % (ref 36.0–46.0)
Hemoglobin: 14 g/dL (ref 12.0–15.0)
MCH: 31.8 pg (ref 26.0–34.0)
MCHC: 35.3 g/dL (ref 30.0–36.0)
MCV: 90.2 fL (ref 78.0–100.0)
Platelets: 326 10*3/uL (ref 150–400)
RBC: 4.4 MIL/uL (ref 3.87–5.11)
RDW: 12.4 % (ref 11.5–15.5)
WBC: 8.9 10*3/uL (ref 4.0–10.5)

## 2016-06-12 LAB — ETHANOL: Alcohol, Ethyl (B): <5 mg/dL (ref <5)

## 2016-06-12 LAB — SALICYLATE LEVEL: Salicylate Lvl: <7 mg/dL (ref 2.8–30.0)

## 2016-06-12 MED ORDER — ACETAMINOPHEN 325 MG PO TABS
650.0000 mg | ORAL_TABLET | Freq: Four times a day (QID) | ORAL | Status: DC | PRN
  Administered 2016-06-13: 650 mg via ORAL
  Filled 2016-06-12: qty 2

## 2016-06-12 MED ORDER — HYDROXYZINE HCL 25 MG PO TABS
25.0000 mg | ORAL_TABLET | Freq: Three times a day (TID) | ORAL | Status: DC | PRN
  Administered 2016-06-12 – 2016-06-13 (×2): 25 mg via ORAL
  Filled 2016-06-12 (×2): qty 1

## 2016-06-12 MED ORDER — MAGNESIUM HYDROXIDE 400 MG/5ML PO SUSP: 30.0000 mL | Freq: Every day | ORAL | Status: DC | PRN

## 2016-06-12 MED ORDER — TRAZODONE HCL 50 MG PO TABS
50.0000 mg | ORAL_TABLET | Freq: Every evening | ORAL | Status: DC | PRN
  Administered 2016-06-12 – 2016-06-17 (×6): 50 mg via ORAL
  Filled 2016-06-12 (×17): qty 1

## 2016-06-12 MED ORDER — ALUM & MAG HYDROXIDE-SIMETH 200-200-20 MG/5ML PO SUSP: 15.0000 mL | ORAL | Status: DC | PRN

## 2016-06-12 NOTE — Tx Team (Signed)
Initial Treatment Plan 06/12/2016 11:48 PM Ameshia Fayson [id number]    PATIENT STRESSORS: Financial difficulties Health problems Legal issue Medication change or noncompliance Occupational concerns Substance abuse   PATIENT STRENGTHS: Ability for insight Average or above average intelligence Capable of independent living Communication skills General fund of knowledge Motivation for treatment/growth Supportive family/friends   PATIENT IDENTIFIED PROBLEMS: "think more positive about myself"  "stop hating myself"  "get back the things I have lost"  "get on medications that help"  Depression  Suicide Risk  Substance Abuse         DISCHARGE CRITERIA:  Ability to meet basic life and health needs Adequate post-discharge living arrangements Improved stabilization in mood, thinking, and/or behavior Medical problems require only outpatient monitoring Motivation to continue treatment in a less acute level of care Need for constant or close observation no longer present Reduction of life-threatening or endangering symptoms to within safe limits Safe-care adequate arrangements made Verbal commitment to aftercare and medication compliance Withdrawal symptoms are absent or subacute and managed without 24-hour nursing intervention  PRELIMINARY DISCHARGE PLAN: Outpatient therapy  PATIENT/FAMILY INVOLVEMENT: This treatment plan has been presented to and reviewed with the patient, Aerabella Koos.  The patient and family have been given the opportunity to ask questions and make suggestions.  Ferrel Logan, RN 06/12/2016, 11:48 PM

## 2016-06-12 NOTE — ED Notes (Signed)
Called staffing office for sitter.   

## 2016-06-12 NOTE — ED Notes (Signed)
No changes, alert, NAD, calm, interactive, pleasant, tearful, speaking with mother at [facility]. Updated with, "bed ready after 2100, going to [facility] [phone], accepted by Dr. Jama Flavors".

## 2016-06-12 NOTE — ED Notes (Signed)
Admits to "just taking zyrtec at this time, not taking anything in awhile for hyperthyroid, last lexapro months ago". Pt updated with status, plan, timeframe, agreeable, pleasant.
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
## 2016-06-12 NOTE — BH Assessment (Signed)
Pt accepted to [facility] after 21:00 on 06/12/16. Report given to Betti Cruz, RN. Call to report [id number]. Attending physician is Dr. Jama Flavors, MD. Accepting provider Dr. Thressa Sheller.  Princess Bruins, LCSWA TTS Specialist
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
## 2016-06-12 NOTE — ED Notes (Signed)
Alert, NAD, calm, interactive, resps e/u, speaking in clear complete sentences, no dyspnea noted. Family at BS. 

## 2016-06-12 NOTE — ED Notes (Signed)
No changes, ambulatory out with RN to [facility] Passy, steady gait. Denies questions or needs.
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
## 2016-06-13 DIAGNOSIS — F332 Major depressive disorder, recurrent severe without psychotic features: Principal | ICD-10-CM

## 2016-06-13 DIAGNOSIS — R45851 Suicidal ideations: Secondary | ICD-10-CM

## 2016-06-13 DIAGNOSIS — Z79899 Other long term (current) drug therapy: Secondary | ICD-10-CM

## 2016-06-13 MED ORDER — SERTRALINE HCL 25 MG PO TABS
25.0000 mg | ORAL_TABLET | Freq: Once | ORAL | Status: AC
  Administered 2016-06-13: 25 mg via ORAL
  Filled 2016-06-13 (×2): qty 1

## 2016-06-13 MED ORDER — CHLORDIAZEPOXIDE HCL 25 MG PO CAPS
25.0000 mg | ORAL_CAPSULE | Freq: Four times a day (QID) | ORAL | Status: AC | PRN
  Administered 2016-06-14 (×2): 25 mg via ORAL
  Filled 2016-06-13 (×2): qty 1

## 2016-06-13 MED ORDER — CHLORDIAZEPOXIDE HCL 25 MG PO CAPS: 25.0000 mg | ORAL_CAPSULE | Freq: Three times a day (TID) | ORAL | Status: DC

## 2016-06-13 MED ORDER — VITAMIN B-1 100 MG PO TABS
100.0000 mg | ORAL_TABLET | Freq: Every day | ORAL | Status: DC
  Administered 2016-06-13 – 2016-06-18 (×6): 100 mg via ORAL
  Filled 2016-06-13 (×8): qty 1

## 2016-06-13 MED ORDER — LOPERAMIDE HCL 2 MG PO CAPS: 2.0000 mg | ORAL_CAPSULE | ORAL | Status: AC | PRN

## 2016-06-13 MED ORDER — ADULT MULTIVITAMIN W/MINERALS CH
1.0000 | ORAL_TABLET | Freq: Every day | ORAL | Status: DC
  Administered 2016-06-13 – 2016-06-18 (×6): 1 via ORAL
  Filled 2016-06-13 (×8): qty 1

## 2016-06-13 MED ORDER — CHLORDIAZEPOXIDE HCL 25 MG PO CAPS: 25.0000 mg | ORAL_CAPSULE | Freq: Every day | ORAL | Status: DC

## 2016-06-13 MED ORDER — ONDANSETRON 4 MG PO TBDP
4.0000 mg | ORAL_TABLET | Freq: Four times a day (QID) | ORAL | Status: AC | PRN
  Administered 2016-06-13 – 2016-06-15 (×3): 4 mg via ORAL
  Filled 2016-06-13 (×3): qty 1

## 2016-06-13 MED ORDER — SERTRALINE HCL 50 MG PO TABS
50.0000 mg | ORAL_TABLET | Freq: Every day | ORAL | Status: DC
  Administered 2016-06-14 – 2016-06-15 (×2): 50 mg via ORAL
  Filled 2016-06-13 (×4): qty 1

## 2016-06-13 MED ORDER — CHLORDIAZEPOXIDE HCL 25 MG PO CAPS: 25.0000 mg | ORAL_CAPSULE | ORAL | Status: DC

## 2016-06-13 MED ORDER — CHLORDIAZEPOXIDE HCL 25 MG PO CAPS
25.0000 mg | ORAL_CAPSULE | Freq: Four times a day (QID) | ORAL | Status: DC
  Administered 2016-06-13: 25 mg via ORAL
  Filled 2016-06-13: qty 1
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
## 2016-06-13 NOTE — Progress Notes (Signed)
Nursing Progress Note 7p-7a  D) Patient provided a [place] sandwich and ginger ale. Patient visited with mom for 10 minutes on the unit after 2300 in the dayroom. Patient requested medication for sleep and anxiety.   A) Emotional support given. 1:1 interaction and active listening provided. Patient medicated with PM orders as prescribed. Medications reviewed with patient. Patient verbalized understanding of medications without further questions.  Opportunities for questions or concerns presented to patient. Patient encouraged to continue to work on treatment goals. Labs, vital signs and patient behavior monitored throughout shift. Patient safety maintained with q15 min safety checks.  R) Patient receptive to interaction with nurse. Patient and mother both tearful during visit.  Patient denies any adverse medication reactions at this time. Patient is resting in bed without complaints and remains safe on the unit at this time. Will continue to monitor.

## 2016-06-13 NOTE — [facility] Group Notes (Signed)
[facility] LCSW Group Therapy Note   06/13/2016  10:10 - 11:10 AM   Type of Therapy and Topic: Group Therapy: Feelings Around Returning Home & Establishing a Supportive Framework and Activity to Identify signs of Improvement or Decompensation   Participation Level: Did Not Attend; invited to participate yet did not despite overhead announcement and encouragement by staff   Description of Group:  Patients first processed thoughts and feelings about up coming discharge. These included fears of upcoming changes, lack of change, new living environments, judgements and expectations from others and overall stigma of MH issues. We then discussed what is a supportive framework? What does it look like feel like and how do I discern it from and unhealthy non-supportive network? Learn how to cope when supports are not helpful and don't support you. Discuss what to do when your family/friends are not supportive.   Catherine C Harrill, LCSW    

## 2016-06-13 NOTE — [facility] Group Notes (Signed)
Patient did not attend AA group. 

## 2016-06-13 NOTE — Progress Notes (Signed)
D: Pt presents with flat affect and depressed mood. Pt rates depression 8/10. Anxiety 9/10. Pt sad and tearful on approach. Pt appears guarded and withdrawn. Pt reported that she was taking xanax daily. Pt hypotensive this morning. Fluid encouraged and b/p monitored.  A: Orders reviewed with pt. Verbal support provided. Pt encouraged to attend groups. 15 minute checks performed for safety.  R: Pt receptive to tx.

## 2016-06-13 NOTE — Plan of Care (Signed)
Problem: Safety: Goal: Periods of time without injury will increase Outcome: Progressing Patient verbally contracts for safety on the unit and is on q15 minute safety checks.

## 2016-06-13 NOTE — Progress Notes (Signed)
Date:  06/13/2016 Time:  1130 am  Group Topic/Focus:  Coping With Mental Health Crisis:   The purpose of this group is to help patients identify strategies for coping with mental health crisis.  Group discusses possible causes of crisis and ways to manage them effectively.  Participation Level:  Did Not Attend  Participation Quality:    Affect:    Cognitive:    Insight:   Engagement in Group:    Modes of Intervention:    Additional Comments:    Tawonna Esquer L 06/13/2016, 3:02 PM  

## 2016-06-14 DIAGNOSIS — F1994 Other psychoactive substance use, unspecified with psychoactive substance-induced mood disorder: Secondary | ICD-10-CM | POA: Diagnosis present

## 2016-06-14 MED ORDER — CLONIDINE HCL 0.1 MG PO TABS: 0.1000 mg | ORAL_TABLET | Freq: Every day | ORAL | Status: DC

## 2016-06-14 MED ORDER — IBUPROFEN 800 MG PO TABS
800.0000 mg | ORAL_TABLET | Freq: Three times a day (TID) | ORAL | Status: DC | PRN
  Administered 2016-06-14 – 2016-06-17 (×4): 800 mg via ORAL
  Filled 2016-06-14 (×4): qty 1

## 2016-06-14 MED ORDER — CLONIDINE HCL 0.1 MG PO TABS
0.1000 mg | ORAL_TABLET | Freq: Four times a day (QID) | ORAL | Status: DC
  Filled 2016-06-14 (×9): qty 1

## 2016-06-14 MED ORDER — DICYCLOMINE HCL 20 MG PO TABS
20.0000 mg | ORAL_TABLET | Freq: Four times a day (QID) | ORAL | Status: DC | PRN
  Administered 2016-06-15: 20 mg via ORAL
  Filled 2016-06-14: qty 1

## 2016-06-14 MED ORDER — ARIPIPRAZOLE 2 MG PO TABS
2.0000 mg | ORAL_TABLET | Freq: Every day | ORAL | Status: DC
  Administered 2016-06-14: 2 mg via ORAL
  Filled 2016-06-14 (×3): qty 1

## 2016-06-14 MED ORDER — METHOCARBAMOL 500 MG PO TABS: 500.0000 mg | ORAL_TABLET | Freq: Three times a day (TID) | ORAL | Status: DC | PRN

## 2016-06-14 MED ORDER — ARIPIPRAZOLE 2 MG PO TABS
2.0000 mg | ORAL_TABLET | Freq: Every day | ORAL | Status: DC
  Administered 2016-06-15 – 2016-06-18 (×4): 2 mg via ORAL
  Filled 2016-06-14 (×5): qty 1

## 2016-06-14 MED ORDER — CLONIDINE HCL 0.1 MG PO TABS
0.1000 mg | ORAL_TABLET | ORAL | Status: DC
  Filled 2016-06-14: qty 1

## 2016-06-14 MED ORDER — HYDROXYZINE HCL 25 MG PO TABS
25.0000 mg | ORAL_TABLET | Freq: Four times a day (QID) | ORAL | Status: DC | PRN
  Administered 2016-06-15: 25 mg via ORAL
  Filled 2016-06-14: qty 1

## 2016-06-14 MED ORDER — DULOXETINE HCL 20 MG PO CPEP: 20.0000 mg | ORAL_CAPSULE | Freq: Two times a day (BID) | ORAL | Status: DC

## 2016-06-14 NOTE — Progress Notes (Signed)
Recreation Therapy Notes  Date: 06/14/16 Time: 0930 Location: [address] Dayroom  Group Topic: Stress Management  Goal Area(s) Addresses:  Patient will verbalize importance of using healthy stress management.  Patient will identify positive emotions associated with healthy stress management.   Intervention: Stress Management  Activity :  LRT introduced the stress management technique of guided imagery.  LRT read a script to engage patients in guided imagery.  Patients were to follow along as the script was read in order to fully participate in the technique.  Education:  Stress Management, Discharge Planning.   Education Outcome: Acknowledges edcuation/In group clarification offered/Needs additional education  Clinical Observations/Feedback: Pt did not attend group.   Misty Jensen, LRT/CTRS         Misty Jensen A 06/14/2016 12:04 PM 

## 2016-06-14 NOTE — Progress Notes (Signed)
Nutrition Brief Note  Patient identified on the Malnutrition Screening Tool (MST) Report  Pt with insignificant weight loss.   Wt Readings from Last 15 Encounters:  06/12/16 140 lb (63.5 kg)  06/12/16 135 lb (61.2 kg)  03/26/15 160 lb (72.6 kg)  01/08/14 180 lb (81.6 kg)    Body mass index is 22.6 kg/m. Patient meets criteria for normal based on current BMI.  Labs and medications reviewed.   No nutrition interventions warranted at this time. If nutrition issues arise, please consult RD.   Tilda Franco, MS, RD, LDN Pager: [phone] After Hours Pager: [phone]
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
## 2016-06-14 NOTE — Progress Notes (Signed)
D: Patient pleasant and cooperative with care this shift, but is noted to have a flat affect endorsing depression. Pt with minimal interaction with staff/peers. A: Encourage staff/peer interaction, medication compliance, and group participation. Administer medications as ordered, maintain Q 15 minute safety checks. R: Pt compliant with medications. Pt denies SI at this time and verbally contracts for safety. No signs/symptoms of distress noted.

## 2016-06-14 NOTE — Tx Team (Signed)
Interdisciplinary Treatment and Diagnostic Plan Update  06/14/2016 Time of Session: 0930 Misty Jensen MRN: [id number]  Principal Diagnosis: MDD recurrent, severe  Secondary Diagnoses: Active Problems:   Severe recurrent major depression without psychotic features (HCC)   Current Medications:  Current Facility-Administered Medications  Medication Dose Route Frequency Provider Last Rate Last Dose  . acetaminophen (TYLENOL) tablet 650 mg  650 mg Oral Q6H PRN Jackelyn Poling, NP   650 mg at 06/13/16 2000  . alum & mag hydroxide-simeth (MAALOX/MYLANTA) 200-200-20 MG/5ML suspension 15 mL  15 mL Oral Q4H PRN Jackelyn Poling, NP      . ARIPiprazole (ABILIFY) tablet 2 mg  2 mg Oral Daily Georgiann Cocker, MD      . chlordiazePOXIDE (LIBRIUM) capsule 25 mg  25 mg Oral Q6H PRN Georgiann Cocker, MD      . hydrOXYzine (ATARAX/VISTARIL) tablet 25 mg  25 mg Oral TID PRN Jackelyn Poling, NP   25 mg at 06/13/16 2122  . loperamide (IMODIUM) capsule 2-4 mg  2-4 mg Oral PRN Georgiann Cocker, MD      . magnesium hydroxide (MILK OF MAGNESIA) suspension 30 mL  30 mL Oral Daily PRN Jackelyn Poling, NP      . multivitamin with minerals tablet 1 tablet  1 tablet Oral Daily Georgiann Cocker, MD   1 tablet at 06/14/16 0828  . ondansetron (ZOFRAN-ODT) disintegrating tablet 4 mg  4 mg Oral Q6H PRN Georgiann Cocker, MD   4 mg at 06/13/16 2000  . sertraline (ZOLOFT) tablet 50 mg  50 mg Oral Daily Georgiann Cocker, MD   50 mg at 06/14/16 0828  . thiamine (VITAMIN B-1) tablet 100 mg  100 mg Oral Daily Georgiann Cocker, MD   100 mg at 06/14/16 0828  . traZODone (DESYREL) tablet 50 mg  50 mg Oral QHS,MR X 1 Jackelyn Poling, NP   50 mg at 06/13/16 2123   PTA Medications: Prescriptions Prior to Admission  Medication Sig Dispense Refill Last Dose  . Cetirizine HCl (ZYRTEC PO) Take by mouth.   06/11/2016 at Unknown time    Patient Stressors: Financial difficulties Health problems Legal issue Medication change or  noncompliance Occupational concerns Substance abuse  Patient Strengths: Ability for insight Average or above average intelligence Capable of independent living Communication skills General fund of knowledge Motivation for treatment/growth Supportive family/friends  Treatment Modalities: Medication Management, Group therapy, Case management,  1 to 1 session with clinician, Psychoeducation, Recreational therapy.   Physician Treatment Plan for Primary Diagnosis: MDD recurrent, severe Long Term Goal(s): Improvement in symptoms so as ready for discharge Improvement in symptoms so as ready for discharge   Short Term Goals: Ability to identify changes in lifestyle to reduce recurrence of condition will improve Ability to disclose and discuss suicidal ideas Ability to identify and develop effective coping behaviors will improve Compliance with prescribed medications will improve Ability to maintain clinical measurements within normal limits will improve Compliance with prescribed medications will improve Ability to identify triggers associated with substance abuse/mental health issues will improve  Medication Management: Evaluate patient's response, side effects, and tolerance of medication regimen.  Therapeutic Interventions: 1 to 1 sessions, Unit Group sessions and Medication administration.  Evaluation of Outcomes: Not Met  Physician Treatment Plan for Secondary Diagnosis: Active Problems:   Severe recurrent major depression without psychotic features (HCC)  Long Term Goal(s): Improvement in symptoms so as ready for discharge Improvement in symptoms so as ready for discharge  Short Term Goals: Ability to identify changes in lifestyle to reduce recurrence of condition will improve Ability to disclose and discuss suicidal ideas Ability to identify and develop effective coping behaviors will improve Compliance with prescribed medications will improve Ability to maintain clinical  measurements within normal limits will improve Compliance with prescribed medications will improve Ability to identify triggers associated with substance abuse/mental health issues will improve     Medication Management: Evaluate patient's response, side effects, and tolerance of medication regimen.  Therapeutic Interventions: 1 to 1 sessions, Unit Group sessions and Medication administration.  Evaluation of Outcomes: Not Met   RN Treatment Plan for Primary Diagnosis:MDD recurrent, severe Long Term Goal(s): Knowledge of disease and therapeutic regimen to maintain health will improve  Short Term Goals: Ability to remain free from injury will improve, Ability to verbalize feelings will improve and Ability to disclose and discuss suicidal ideas  Medication Management: RN will administer medications as ordered by provider, will assess and evaluate patient's response and provide education to patient for prescribed medication. RN will report any adverse and/or side effects to prescribing provider.  Therapeutic Interventions: 1 on 1 counseling sessions, Psychoeducation, Medication administration, Evaluate responses to treatment, Monitor vital signs and CBGs as ordered, Perform/monitor CIWA, COWS, AIMS and Fall Risk screenings as ordered, Perform wound care treatments as ordered.  Evaluation of Outcomes: Not Met   LCSW Treatment Plan for Primary Diagnosis:MDD recurrent, severe Long Term Goal(s): Safe transition to appropriate next level of care at discharge, Engage patient in therapeutic group addressing interpersonal concerns.  Short Term Goals: Engage patient in aftercare planning with referrals and resources, Facilitate patient progression through stages of change regarding substance use diagnoses and concerns and Identify triggers associated with mental health/substance abuse issues  Therapeutic Interventions: Assess for all discharge needs, 1 to 1 time with Social worker, Explore available  resources and support systems, Assess for adequacy in community support network, Educate family and significant other(s) on suicide prevention, Complete Psychosocial Assessment, Interpersonal group therapy.  Evaluation of Outcomes: Not Met   Progress in Treatment: Attending groups: No. New to unit. Continuing to assess.  Participating in groups: No. Taking medication as prescribed: Yes. Toleration medication: Yes. Family/Significant other contact made: No, will contact:  family member if patient consents Patient understands diagnosis: Yes. Discussing patient identified problems/goals with staff: Yes. Medical problems stabilized or resolved: Yes. Denies suicidal/homicidal ideation: No. Passive SI/Able to contract for safety on the unit.  Issues/concerns per patient self-inventory: No. Other: n/a   New problem(s) identified: No, Describe:  n/a  New Short Term/Long Term Goal(s): detox; medication stabilization; development of comprehensive mental wellness/sobriety plan.   Discharge Plan or Barriers: CSW assessing for appropriate referrals. At this time, pt is declining to attend discharge planning group/groups in the milieu.   Reason for Continuation of Hospitalization: Depression Hallucinations Medication stabilization Suicidal ideation Withdrawal symptoms  Estimated Length of Stay: 3-5 days   Attendees: Patient: 06/14/2016 1:04 PM  Physician: Dr. Sanjuana Letters MD 06/14/2016 1:04 PM  Nursing: Verdene Rio RN 06/14/2016 1:04 PM  RN Care Manager: Lars Pinks CM 06/14/2016 1:04 PM  Social Worker: Press photographer, LCSW 06/14/2016 1:04 PM  Recreational Therapist: Rhunette Croft 06/14/2016 1:04 PM  Other: Lindell Spar NP; Ricky Ala NP 06/14/2016 1:04 PM  Other:  06/14/2016 1:04 PM  Other: 06/14/2016 1:04 PM    Scribe for Treatment Team: [place], LCSW 06/14/2016 1:04 PM
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
## 2016-06-14 NOTE — [facility] Group Notes (Signed)
[facility] LCSW Group Therapy  06/14/2016 1:06 PM  Type of Therapy:  Group Therapy  Participation Level:  Did Not Attend-pt invited. Chose to remain in bed.   Summary of Progress/Problems: Today's Topic: Overcoming Obstacles. Patients identified one short term goal and potential obstacles in reaching this goal. Patients processed barriers involved in overcoming these obstacles. Patients identified steps necessary for overcoming these obstacles and explored motivation (internal and external) for facing these difficulties head on.   Mayte N Smart  LCSW 06/14/2016, 1:06 PM

## 2016-06-14 NOTE — [facility] Group Notes (Addendum)
Pt attended spiritual care group on grief and loss facilitated by chaplain Burnis Kingfisher   Group opened with brief discussion and psycho-social ed around grief and loss in relationships and in relation to self - identifying life patterns, circumstances, changes that cause losses. Established group norm of speaking from own life experience. Group goal of establishing open and affirming space for members to share loss and experience with grief, normalize grief experience and provide psycho social education and grief support.    Misty Jensen was present throughout group.  Was acknowledged by facilitator and group members who inquired about her response to group.  Misty Jensen did not contribute to group discussion.    Belva Crome MDiv

## 2016-06-14 NOTE — Progress Notes (Addendum)
D: Patient is isolative to her room; she declines group and has not been participating.  She denies any thoughts of self harm.  She denies HI and does not appear to be responding to internal stimuli.  Patient presents with flat, blunted affect.  She is sad and depressed. Patient states that she was currently in a monitoring program with the nursing board.  Patient received a call from the nursing board requesting her H&P.  Patient has fax information and contact.  Patient states,"I know I have to work for it now." Patient reports withdrawal symptoms such as chills and body discomfort.  She was given a dose of librium with good results.   A: Continue to monitor medication management and MD orders.  Safety checks completed every 15 minutes per protocol.  Offer support and encouragement as needed. R: Patient is receptive to staff; her behavior is appropriate.

## 2016-06-15 DIAGNOSIS — F1994 Other psychoactive substance use, unspecified with psychoactive substance-induced mood disorder: Secondary | ICD-10-CM

## 2016-06-15 MED ORDER — VENLAFAXINE HCL ER 37.5 MG PO CP24
37.5000 mg | ORAL_CAPSULE | Freq: Every day | ORAL | Status: DC
  Administered 2016-06-16: 37.5 mg via ORAL
  Filled 2016-06-15 (×2): qty 1

## 2016-06-15 NOTE — Progress Notes (Signed)
Nursing Progress Note 7p-7a  D) Patient presents mildly anxious but is cooperative with Clinical research associate. Patient appears depressed. Patient is seen interactive in the milieu. Patient states "I am looking into long term rehab in [place]. I know I can't be home right now." Patient states "I'm having mild withdrawal symptoms, but the medications help manage it. I had a bad dream two nights ago that someone was chasing me". Patient denies SI/HI/AVH or acute pain. Patient contracts for safety on the unit. Patient did attend group. Patient is scoring low on CIWA/COW.  A) Emotional support given. 1:1 interaction and active listening provided. Patient medicated with PM orders as prescribed. Medications reviewed with patient. Patient verbalized understanding of medications without further questions.  Snacks and fluids provided. Opportunities for questions or concerns presented to patient. Patient encouraged to continue to work on treatment goals. Labs, vital signs and patient behavior monitored throughout shift. Patient safety maintained with q15 min safety checks.  R) Patient receptive to interaction with nurse. Patient remains safe on the unit at this time. Patient denies any adverse medication reactions at this time. Patient is resting in bed without complaints. Will continue to monitor.

## 2016-06-15 NOTE — [facility] Group Notes (Signed)
The focus of this group is to educate the patient on the purpose and policies of crisis stabilization and provide a format to answer questions about their admission.  The group details unit policies and expectations of patients while admitted.  Patient did not attend 0900 nurse education orientation group this morning.  Patient stayed in room.  

## 2016-06-15 NOTE — [facility] Group Notes (Signed)
[facility] LCSW Group Therapy  06/15/2016 1:50 PM  Type of Therapy:  Group Therapy  Participation Level:  Active  Participation Quality:  Attentive  Affect:  Appropriate  Cognitive:  Alert and Oriented  Insight:  Improving  Engagement in Therapy:  Improving  Modes of Intervention:  Discussion, Education, Exploration, Problem-solving, Socialization and Support  Summary of Progress/Problems: MHA Speaker came to talk about his personal journey with substance abuse and addiction. The pt processed ways by which to relate to the speaker. MHA speaker provided handouts and educational information pertaining to groups and services offered by the [facility].   Misty N Smart LCSW 06/15/2016, 1:50 PM

## 2016-06-15 NOTE — Plan of Care (Signed)
Problem: Education: Goal: Knowledge of [facility] Education information/materials will improve Outcome: Progressing Nurse discussed depression/anxiety/coping skills with patient.    

## 2016-06-15 NOTE — Progress Notes (Signed)
Misty Jensen was up and visible in milieu this evening, did attend and participate in evening group activity. Misty Jensen complained of body aches and various withdrawal symptoms. She requested and received various medications to help withdrawal before retiring to her room shortly after 10 pm. A. Support and encouragement provided. R. Safety maintained, will continue to monitor.

## 2016-06-15 NOTE — Progress Notes (Signed)
[facility] referral sent per patient request. Pt stated that her mother would likely be able to provide transportation if she were to be accepted for treatment.   Trula Slade, MSW, LCSW Clinical Social Worker 06/15/2016 3:46 PM

## 2016-06-15 NOTE — Progress Notes (Signed)
[facility] MD Progress Note  06/15/2016 2:44 PM Misty Jensen  MRN:  [id number] Subjective:  Patient states that she feels weak.  Feels like the in a fog.   Objective:  Patient states that Zoloft makes her feel foggy.  Patient had been on it in the past and the effect is the same.  Describes as being depressed since age of 20,  She reports that her license is on probation and that she is worried that she will have to surrender for a year, patient's UDS is positive for cocaine, opiates and BZD's amphetamines.   Patient is compliant with meds, weak and spent most of the morning in her room sleeping. She is willing and ready to tackle her substance abuse issues, she states that a previous boyfriend introduced her to drugs and she developed a dependence on illicit substances.    Principal Problem: MDD Recurrent                                  SUD Diagnosis:   Patient Active Problem List   Diagnosis Date Noted  . Substance induced mood disorder (HCC) [F19.94] 06/14/2016  . Severe recurrent major depression without psychotic features (HCC) [F33.2] 06/12/2016   Total Time spent with patient: 30 minutes  Past Psychiatric History: As in H&P  Past Medical History:  Past Medical History:  Diagnosis Date  . Anxiety   . Asthma   . Depression   . Drug abuse    THC, cocaine  . Hypothyroidism    patient reports "in past"  . Thyroid disease    History reviewed. No pertinent surgical history. Family History: History reviewed. No pertinent family history. Family Psychiatric  History: As in H&P Social History:  History  Alcohol Use  . Yes    Comment: occasional     History  Drug Use  . Types: Cocaine, Marijuana, Benzodiazepines, "Crack" cocaine, MDMA (Ecstacy)    Comment: xanax    Social History   Social History  . Marital status: Single    Spouse name: N/A  . Number of children: N/A  . Years of education: N/A   Social History Main Topics  . Smoking status: Never Smoker  . Smokeless  tobacco: Never Used  . Alcohol use Yes     Comment: occasional  . Drug use: Yes    Types: Cocaine, Marijuana, Benzodiazepines, "Crack" cocaine, MDMA (Ecstacy)     Comment: xanax  . Sexual activity: Yes    Birth control/ protection: Condom   Other Topics Concern  . None   Social History Narrative  . None   Additional Social History:      Sleep: Fair  Appetite:  Fair  Current Medications: Current Facility-Administered Medications  Medication Dose Route Frequency Provider Last Rate Last Dose  . acetaminophen (TYLENOL) tablet 650 mg  650 mg Oral Q6H PRN Jackelyn Poling, NP   650 mg at 06/13/16 2000  . alum & mag hydroxide-simeth (MAALOX/MYLANTA) 200-200-20 MG/5ML suspension 15 mL  15 mL Oral Q4H PRN Jackelyn Poling, NP      . ARIPiprazole (ABILIFY) tablet 2 mg  2 mg Oral Daily Kerry Hough, PA-C   2 mg at 06/15/16 0803  . chlordiazePOXIDE (LIBRIUM) capsule 25 mg  25 mg Oral Q6H PRN Georgiann Cocker, MD   25 mg at 06/14/16 2206  . dicyclomine (BENTYL) tablet 20 mg  20 mg Oral Q6H PRN Kerry Hough,  PA-C      . hydrOXYzine (ATARAX/VISTARIL) tablet 25 mg  25 mg Oral Q6H PRN Kerry Hough, PA-C      . ibuprofen (ADVIL,MOTRIN) tablet 800 mg  800 mg Oral TID BM PRN Kerry Hough, PA-C   800 mg at 06/14/16 2136  . loperamide (IMODIUM) capsule 2-4 mg  2-4 mg Oral PRN Georgiann Cocker, MD      . magnesium hydroxide (MILK OF MAGNESIA) suspension 30 mL  30 mL Oral Daily PRN Jackelyn Poling, NP      . methocarbamol (ROBAXIN) tablet 500 mg  500 mg Oral Q8H PRN Kerry Hough, PA-C      . multivitamin with minerals tablet 1 tablet  1 tablet Oral Daily Georgiann Cocker, MD   1 tablet at 06/15/16 0803  . ondansetron (ZOFRAN-ODT) disintegrating tablet 4 mg  4 mg Oral Q6H PRN Georgiann Cocker, MD   4 mg at 06/15/16 0655  . thiamine (VITAMIN B-1) tablet 100 mg  100 mg Oral Daily Georgiann Cocker, MD   100 mg at 06/15/16 0803  . traZODone (DESYREL) tablet 50 mg  50 mg Oral QHS,MR X 1  Jackelyn Poling, NP   50 mg at 06/14/16 2206  . [START ON 06/16/2016] venlafaxine XR (EFFEXOR-XR) 24 hr capsule 37.5 mg  37.5 mg Oral Q breakfast Adonis Brook, NP        Lab Results:  No results found for this or any previous visit (from the past 48 hour(s)).  Blood Alcohol level:  Lab Results  Component Value Date   ETH <5 06/12/2016    Metabolic Disorder Labs: No results found for: HGBA1C, MPG No results found for: PROLACTIN No results found for: CHOL, TRIG, HDL, CHOLHDL, VLDL, LDLCALC  Physical Findings: AIMS: Facial and Oral Movements Muscles of Facial Expression: None, normal Lips and Perioral Area: None, normal Jaw: None, normal Tongue: None, normal,Extremity Movements Upper (arms, wrists, hands, fingers): None, normal Lower (legs, knees, ankles, toes): None, normal, Trunk Movements Neck, shoulders, hips: None, normal, Overall Severity Severity of abnormal movements (highest score from questions above): None, normal Incapacitation due to abnormal movements: None, normal Patient's awareness of abnormal movements (rate only patient's report): No Awareness, Dental Status Current problems with teeth and/or dentures?: No Does patient usually wear dentures?: No  CIWA:  CIWA-Ar Total: 10 COWS:     Musculoskeletal: Strength & Muscle Tone: within normal limits Gait & Station: normal Patient leans: N/A  Psychiatric Specialty Exam: Physical Exam  Nursing note and vitals reviewed. Constitutional: She is oriented to person, place, and time. She appears well-developed and well-nourished.  HENT:  Head: Normocephalic and atraumatic.  Eyes: Conjunctivae and EOM are normal. Pupils are equal, round, and reactive to light.  Neck: Normal range of motion. Neck supple.  Cardiovascular: Normal rate, regular rhythm and normal heart sounds.   Respiratory: Effort normal and breath sounds normal.  GI: Soft. Bowel sounds are normal.  Musculoskeletal: Normal range of motion.  Neurological:  She is alert and oriented to person, place, and time. She has normal reflexes.  Skin: Skin is warm and dry.  Psychiatric:  As above    ROS  Blood pressure (!) 88/50, pulse 92, temperature 98.3 F (36.8 C), temperature source Oral, resp. rate 16, height  (1.676 m), weight 63.5 kg (140 lb), SpO2 99 %.Body mass index is 22.6 kg/m.  General Appearance: Withdrawn, moderate rapport, moderate eye contact. Tearful during interview.   Eye Contact:  Moderate  Speech:  Soft spoken  Volume:  Decreased  Mood:  Depressed and Hopeless  Affect:  Blunted and mood congruent.   Thought Process: Slow speed of thought.  Linear  Orientation:  Full (Time, Place, and Person)  Thought Content:  Negative ruminations about the past. No delusional theme. No preoccupation with violent thoughts. No obsession.  No hallucination in any modality.   Suicidal Thoughts:  Off and on. No intent to act here.   Homicidal Thoughts:  No  Memory:  Immediate;   Fair Recent;   Fair Remote;   Fair  Judgement:  Fair  Insight:  Good  Psychomotor Activity:  Decreased  Concentration:  Concentration: Fair and Attention Span: Fair  Recall:  [employer] of Knowledge:  Good  Language:  Good  Akathisia:  No  Handed:    AIMS (if indicated):     Assets:  Communication Skills Desire for Improvement Physical Health Resilience  ADL's:  Impaired  Cognition:  WNL  Sleep:  Number of Hours: 6.75   Treatment Plan Summary: Psychiatric: SUD MDD recurrent  Medical:  Psychosocial:  Loss of job Loss of transportation  Potential loss of her housing.  Dysfunction relationship.   PLAN: 1. Abilify 2 mg daily 2. Continue other medications at current dose, Zoloft makes patient foggy and would like to refuse med, states that her mother had good effect on Effexor, trial of 37.5 mg with am meal.   3. SW would assist patient get resources 4. Collateral from her family 5. Continue to encourage unit groups and activities.    Lindwood Qua, NP [facility] 06/15/2016, 2:44 PM   Agree with NP progress note

## 2016-06-15 NOTE — Progress Notes (Signed)
Per patient request, Assessment note and H&P faxed ATTN: Allena Napoleon (counselor) at [facility] Board of Nursing: [phone]. Phone number: [phone]. Patient signed consent and ROI for this communication. CSW spoke with Chip Boer who confirmed the above fax and requested assessment/H&P notes faxed to her attention. Pt made aware that fax was sent.   Trula Slade, MSW, LCSW Clinical Social Worker 06/15/2016 3:43 PM

## 2016-06-15 NOTE — Plan of Care (Signed)
Problem: Activity: Goal: Interest or engagement in activities will improve Outcome: Progressing Patient is seen interactive with peers in the milieu; patient is attending groups.

## 2016-06-15 NOTE — [facility] Suicide Risk Assessment (Signed)
[facility] INPATIENT:  Family/Significant Other Suicide Prevention Education  Suicide Prevention Education:  Education Completed; Misty Jensen (pt's mother) [phone] has been identified by the patient as the family member/significant other with whom the patient will be residing, and identified as the person(s) who will aid the patient in the event of a mental health crisis (suicidal ideations/suicide attempt).  With written consent from the patient, the family member/significant other has been provided the following suicide prevention education, prior to the and/or following the discharge of the patient.  The suicide prevention education provided includes the following:  Suicide risk factors  Suicide prevention and interventions  National Suicide Hotline telephone number  [facility] assessment telephone number  [facility] Emergency Assistance 911  [facility] and/or Residential Mobile Crisis Unit telephone number  Request made of family/significant other to:  Remove weapons (e.g., guns, rifles, knives), all items previously/currently identified as safety concern.    Remove drugs/medications (over-the-counter, prescriptions, illicit drugs), all items previously/currently identified as a safety concern.  The family member/significant other verbalizes understanding of the suicide prevention education information provided.  The family member/significant other agrees to remove the items of safety concern listed above.  Misty N Smart LCSW 06/15/2016, 2:25 PM

## 2016-06-15 NOTE — Progress Notes (Signed)
D:  Patient denied SI and HI this morning, contracts for safety.  Denied A/V hallucinations. A:  Medications administered per MD orders, except clonidine because of low BP.  Emotional support and encouragement given patient. R:  Safety maintained with 15 minute checks.

## 2016-06-15 NOTE — Progress Notes (Signed)
Adult Psychoeducational Group Note  Date:  06/15/2016 Time:  1:51 AM  Group Topic/Focus:  Wrap-Up Group:   The focus of this group is to help patients review their daily goal of treatment and discuss progress on daily workbooks.  Participation Level:  Active  Participation Quality:  Appropriate, Attentive, Sharing and Supportive  Affect:  Appropriate  Cognitive:  Appropriate  Insight: Appropriate  Engagement in Group:  Developing/Improving  Modes of Intervention:  Discussion, Socialization and Support  Additional Comments:  Pt attended wrap up group. Pt shared her goal is take a shower, pt stated she did and feels better. 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   Karleen Hampshire Brittini 06/15/2016, 1:51 AM

## 2016-06-16 MED ORDER — VENLAFAXINE HCL ER 75 MG PO CP24
75.0000 mg | ORAL_CAPSULE | Freq: Every day | ORAL | Status: DC
  Administered 2016-06-17 – 2016-06-18 (×2): 75 mg via ORAL
  Filled 2016-06-16 (×3): qty 1

## 2016-06-16 NOTE — Progress Notes (Signed)
[facility] MD Progress Note  06/16/2016 1:13 PM Shatora Weatherbee  MRN:  [id number] Subjective: Patient reports ongoing depression, anxiety, but does report some improvement compared to admission, and at this time is future oriented, states she knows she cannot currently return to her home after discharge as it would increase risk of relapse. States she has been in a toxic relationship , and needs to focus on herself and her recovery in a safe, sober environment. Remains ruminative about her losses, which she attributes to substance abuse/ relapse. She has , however, a history of long term recovery/abstinence and was high functioning when sober, and expresses a hope that she will return to this level of functioning as she remains abstinent. Thus far tolerating medications ( now on Effexor XR and Abilify) well . Does not endorse side effects.  Objective:   I have discussed case with treatment team and have met with patient .  She presents partially improved compared to admission, with improving range of affect, more future oriented, but still ruminative about psychosocial losses and anxious. States she had communication with Nursing Board yesterday re: her license status. No disruptive or agitated behaviors on unit, going to some groups. Thus far tolerating Effexor XR and Abilify well , which replaced Zoloft, which she was having side effects on.   Principal Problem: MDD Recurrent                                  SUD Diagnosis:   Patient Active Problem List   Diagnosis Date Noted  . Substance induced mood disorder ([place]) [F19.94] 06/14/2016  . Severe recurrent major depression without psychotic features ([place]) [F33.2] 06/12/2016   Total Time spent with patient: 20 minutes  Past Psychiatric History: As in H&P  Past Medical History:  Past Medical History:  Diagnosis Date  . Anxiety   . Asthma   . Depression   . Drug abuse    THC, cocaine  . Hypothyroidism    patient reports "in past"  . Thyroid  disease    History reviewed. No pertinent surgical history. Family History: History reviewed. No pertinent family history. Family Psychiatric  History: As in H&P Social History:  History  Alcohol Use  . Yes    Comment: occasional     History  Drug Use  . Types: Cocaine, Marijuana, Benzodiazepines, "Crack" cocaine, MDMA (Ecstacy)    Comment: xanax    Social History   Social History  . Marital status: Single    Spouse name: N/A  . Number of children: N/A  . Years of education: N/A   Social History Main Topics  . Smoking status: Never Smoker  . Smokeless tobacco: Never Used  . Alcohol use Yes     Comment: occasional  . Drug use: Yes    Types: Cocaine, Marijuana, Benzodiazepines, "Crack" cocaine, MDMA (Ecstacy)     Comment: xanax  . Sexual activity: Yes    Birth control/ protection: Condom   Other Topics Concern  . None   Social History Narrative  . None   Additional Social History:      Sleep: Fair- improving   Appetite:  Fair  Current Medications: Current Facility-Administered Medications  Medication Dose Route Frequency Provider Last Rate Last Dose  . acetaminophen (TYLENOL) tablet 650 mg  650 mg Oral Q6H PRN Rozetta Nunnery, NP   650 mg at 06/13/16 2000  . alum & mag hydroxide-simeth (MAALOX/MYLANTA) 200-200-20 MG/5ML suspension  15 mL  15 mL Oral Q4H PRN Rozetta Nunnery, NP      . ARIPiprazole (ABILIFY) tablet 2 mg  2 mg Oral Daily Laverle Hobby, PA-C   2 mg at 06/16/16 0981  . chlordiazePOXIDE (LIBRIUM) capsule 25 mg  25 mg Oral Q6H PRN Artist Beach, MD   25 mg at 06/14/16 2206  . dicyclomine (BENTYL) tablet 20 mg  20 mg Oral Q6H PRN Laverle Hobby, PA-C   20 mg at 06/15/16 1752  . hydrOXYzine (ATARAX/VISTARIL) tablet 25 mg  25 mg Oral Q6H PRN Laverle Hobby, PA-C   25 mg at 06/15/16 2142  . ibuprofen (ADVIL,MOTRIN) tablet 800 mg  800 mg Oral TID BM PRN Laverle Hobby, PA-C   800 mg at 06/15/16 1751  . loperamide (IMODIUM) capsule 2-4 mg  2-4 mg Oral  PRN Artist Beach, MD      . magnesium hydroxide (MILK OF MAGNESIA) suspension 30 mL  30 mL Oral Daily PRN Rozetta Nunnery, NP      . methocarbamol (ROBAXIN) tablet 500 mg  500 mg Oral Q8H PRN Laverle Hobby, PA-C      . multivitamin with minerals tablet 1 tablet  1 tablet Oral Daily Artist Beach, MD   1 tablet at 06/16/16 [phone]  . ondansetron (ZOFRAN-ODT) disintegrating tablet 4 mg  4 mg Oral Q6H PRN Artist Beach, MD   4 mg at 06/15/16 1751  . thiamine (VITAMIN B-1) tablet 100 mg  100 mg Oral Daily Artist Beach, MD   100 mg at 06/16/16 0832  . traZODone (DESYREL) tablet 50 mg  50 mg Oral QHS,MR X 1 Rozetta Nunnery, NP   50 mg at 06/15/16 2142  . venlafaxine XR (EFFEXOR-XR) 24 hr capsule 37.5 mg  37.5 mg Oral Q breakfast Kerrie Buffalo, NP   37.5 mg at 06/16/16 7829    Lab Results:  No results found for this or any previous visit (from the past 48 hour(s)).  Blood Alcohol level:  Lab Results  Component Value Date   ETH <5 56/21/3086    Metabolic Disorder Labs: No results found for: HGBA1C, MPG No results found for: PROLACTIN No results found for: CHOL, TRIG, HDL, CHOLHDL, VLDL, LDLCALC  Physical Findings: AIMS: Facial and Oral Movements Muscles of Facial Expression: None, normal Lips and Perioral Area: None, normal Jaw: None, normal Tongue: None, normal,Extremity Movements Upper (arms, wrists, hands, fingers): None, normal Lower (legs, knees, ankles, toes): None, normal, Trunk Movements Neck, shoulders, hips: None, normal, Overall Severity Severity of abnormal movements (highest score from questions above): None, normal Incapacitation due to abnormal movements: None, normal Patient's awareness of abnormal movements (rate only patient's report): No Awareness, Dental Status Current problems with teeth and/or dentures?: No Does patient usually wear dentures?: No  CIWA:  CIWA-Ar Total: 0 COWS:  COWS Total Score: 0  Musculoskeletal: Strength & Muscle Tone:  within normal limits Gait & Station: normal Patient leans: N/A  Psychiatric Specialty Exam: Physical Exam  Nursing note and vitals reviewed. Constitutional: She is oriented to person, place, and time. She appears well-developed and well-nourished.  HENT:  Head: Normocephalic and atraumatic.  Eyes: Conjunctivae and EOM are normal. Pupils are equal, round, and reactive to light.  Neck: Normal range of motion. Neck supple.  Cardiovascular: Normal rate, regular rhythm and normal heart sounds.   Respiratory: Effort normal and breath sounds normal.  GI: Soft. Bowel sounds are normal.  Musculoskeletal: Normal range of motion.  Neurological: She is alert and oriented to person, place, and time. She has normal reflexes.  Skin: Skin is warm and dry.  Psychiatric:  As above    ROS no chest pain, no shortness of breath, no fever   Blood pressure 103/68, pulse 73, temperature 98.1 F (36.7 C), resp. rate 16, height _0  (1.676 m), weight 63.5 kg (140 lb), SpO2 99 %.Body mass index is 22.6 kg/m.  General Appearance: well groomed .   Eye Contact:   Good    Speech: Normal   Volume:  Decreased  Mood:  remains depressed, but better than on admission, improving   Affect:  Anxious, vaguely constricted, but smiles at times appropriately    Thought Process:   Goal Directed and Descriptions of Associations: Intact  Orientation:  Other:  fully alert and attentive   Thought Content:  Denies hallucinations, no delusions, not internally preoccupied   Suicidal Thoughts: No suicidal or self injurious ideations at this time, contracts for safety on unit at this time.  Homicidal Thoughts:  No no homicidal or violent ideations  Memory:  Recent and remote grossly intact   Judgement:  Other:  improving  Insight:  Present  Psychomotor Activity:  Decreased  Concentration:  Concentration: Good and Attention Span: Good  Recall:  Good  Fund of Knowledge:  Good  Language:  Good  Akathisia:  Negative  Handed:     AIMS (if indicated):     Assets:  Communication Skills Desire for Improvement Resilience  ADL's:  Intact  Cognition:  WNL  Sleep:  Number of Hours: 6.75  Assessment- patient remains depressed, anxious, ruminative about losses , stressors, but reports some improvement compared to admission, affect is improving , and is currently future oriented, and expressing some optimism about further improvement . She is tolerating Abilify and Effexor XR trials well thus far. She wants to go to a residential, rehab setting at discharge Treatment Plan Summary:   PLAN: Reviewed today 4/4 as below. Encourage ongoing group and milieu participation to work on coping skills and symptom reduction  Continue to encourage and support efforts to work on sobriety and relapse prevention  Treatment team working on disposition options, as above, wants to go to Rehab at discharge  Continue  Abilify 2 mg daily for antidepressant agumentation  Increase  Effexor XR to 75  mg daily for depression  Continue Trazodone 50 mgrs QHS PRN for insomnia as needed  Continue Vistaril 25 mgrs Q 6 hours PRN for anxiety   Jenne Campus, MD  06/16/2016, 1:13 PM   Patient ID: Willey Blade, female   DOB: [birth year], 31 y.o.   MRN: [id number]

## 2016-06-16 NOTE — Progress Notes (Signed)
Recreation Therapy Notes  Date: 06/16/16 Time: 0930 Location: [address] Dayroom  Group Topic: Stress Management  Goal Area(s) Addresses:  Patient will verbalize importance of using healthy stress management.  Patient will identify positive emotions associated with healthy stress management.   Intervention: Stress Management  Activity :  Progressive Muscle Relaxation.  LRT introduced the stress management technique of progressive muscle relaxation.  LRT read a script to allow patients to tense and relax each muscle group individually.  Patients were to follow along as LRT read script to engage in activity.  Education:  Stress Management, Discharge Planning.   Education Outcome: Acknowledges edcuation/In group clarification offered/Needs additional education  Clinical Observations/Feedback: Pt did not attend group.   Caroll Rancher, LRT/CTRS         Caroll Rancher A 06/16/2016 12:14 PM

## 2016-06-16 NOTE — Progress Notes (Signed)
Adult Psychoeducational Group Note  Date:  06/16/2016 Time:  2:28 AM  Group Topic/Focus:  Wrap-Up Group:   The focus of this group is to help patients review their daily goal of treatment and discuss progress on daily workbooks.  Participation Level:  Active  Participation Quality:  Appropriate, Attentive, Sharing and Supportive  Affect:  Appropriate  Cognitive:  Appropriate  Insight: Appropriate and Good  Engagement in Group:  Developing/Improving  Modes of Intervention:  Discussion, Socialization and Support  Additional Comments:  Pt attended wrap up group. Pt goal was to get out of bed and be active in treatment. Pt was unable to meet goal due to being sick and dizziness. Pt stated she did get to 2 groups during the day. 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 Pt stated she looks forward to going and getting herself back on track.  Misty Jensen Brittini 06/16/2016, 2:28 AM

## 2016-06-16 NOTE — Progress Notes (Signed)
D: Patient denies SI/HI and A/V hallucinations; patient denies withdrawal symptoms; patient reports that she feels better this week  A: Monitored q 15 minutes; patient encouraged to attend groups; patient educated about medications; patient given medications per physician orders; patient encouraged to express feelings and/or concerns  R: Patient is flat and sad but laughs and joking with staff at time;  patient patient's interaction with staff and peers is appropriate; patient was able to set goal to talk with staff 1:1 when having feelings of SI; patient is taking medications as prescribed and tolerating medications; patient is attending all groups

## 2016-06-16 NOTE — [facility] Group Notes (Signed)
[facility] LCSW Group Therapy  06/16/2016 3:26 PM  Type of Therapy:  Group Therapy  Participation Level:  Active  Participation Quality:  Attentive  Affect:  Appropriate  Cognitive:  Alert and Oriented  Insight:  Improving  Engagement in Therapy:  Engaged  Modes of Intervention:  Confrontation, Discussion, Education, Problem-solving, Socialization and Support  Summary of Progress/Problems:  Finding Balance in Life. Today's group focused on defining balance in one's own words, identifying things that can knock one off balance, and exploring healthy ways to maintain balance in life. Group members were asked to provide an example of a time when they felt off balance, describe how they handled that situation,and process healthier ways to regain balance in the future. Group members were asked to share the most important tool for maintaining balance that they learned while at [facility] and how they plan to apply this method after discharge.   Roby N Smart LCSW 06/16/2016, 3:26 PM

## 2016-06-17 NOTE — Progress Notes (Signed)
  [facility] Adult Case Management Discharge Plan :  Will you be returning to the same living situation after discharge:  No. Pt accepted to [facility] Dorisann Frames).  At discharge, do you have transportation home?: Yes,  pt's mother will transport pt to facility on Friday 06/18/16 Do you have the ability to pay for your medications: Yes,  mental health  Release of information consent forms completed and submitted to medical records by CSW.  Patient to Follow up at: Follow-up Information    [facility] Follow up.   Why:  You have been accepted for admission on Friday between 11am-12pm. Admitting MD: Dr. Loel Lofty. Please bring paperwork provided to you from the hospital in addition to you prescriptions. Your mother will pick you up between 7am-8am to transport you.  Contact information: [address]  [place], [place] [address] Phone: [phone] Fax: [phone]          Next level of care provider has access to [facility]:no  Safety Planning and Suicide Prevention discussed: Yes,  SPE completed with pt's mother. Collateral information also obtained.   Have you used any form of tobacco in the last 30 days? (Cigarettes, Smokeless Tobacco, Cigars, and/or Pipes): No  Has patient been referred to the Quitline?: Patient refused referral  Patient has been referred for addiction treatment: Yes  Bessy N Smart LCSW 06/17/2016, 10:12 AM

## 2016-06-17 NOTE — [facility] Group Notes (Signed)
[facility] LCSW Group Therapy  06/17/2016 3:46 PM  Type of Therapy:  Group Therapy  Participation Level:  Active  Participation Quality:  Attentive  Affect:  Appropriate  Cognitive:  Alert and Oriented  Insight:  Improving  Engagement in Therapy:  Improving  Modes of Intervention:  Confrontation, Problem-solving, Socialization and Support  Summary of Progress/Problems: Emotion Regulation: This group focused on both positive and negative emotion identification and allowed group members to process ways to identify feelings, regulate negative emotions, and find healthy ways to manage internal/external emotions. Group members were asked to reflect on a time when their reaction to an emotion led to a negative outcome and explored how alternative responses using emotion regulation would have benefited them. Group members were also asked to discuss a time when emotion regulation was utilized when a negative emotion was experienced.   Yumna N Smart LCSW 06/17/2016, 3:46 PM

## 2016-06-17 NOTE — Tx Team (Signed)
Interdisciplinary Treatment and Diagnostic Plan Update  06/17/2016 Time of Session: [place] MRN: [id number]  Principal Diagnosis: MDD recurrent, severe  Secondary Diagnoses: Active Problems:   Severe recurrent major depression without psychotic features (HCC)   Substance induced mood disorder (HCC)   Current Medications:  Current Facility-Administered Medications  Medication Dose Route Frequency Provider Last Rate Last Dose  . acetaminophen (TYLENOL) tablet 650 mg  650 mg Oral Q6H PRN Rozetta Nunnery, NP   650 mg at 06/13/16 2000  . alum & mag hydroxide-simeth (MAALOX/MYLANTA) 200-200-20 MG/5ML suspension 15 mL  15 mL Oral Q4H PRN Rozetta Nunnery, NP      . ARIPiprazole (ABILIFY) tablet 2 mg  2 mg Oral Daily Laverle Hobby, PA-C   2 mg at 06/17/16 0836  . dicyclomine (BENTYL) tablet 20 mg  20 mg Oral Q6H PRN Laverle Hobby, PA-C   20 mg at 06/15/16 1752  . hydrOXYzine (ATARAX/VISTARIL) tablet 25 mg  25 mg Oral Q6H PRN Laverle Hobby, PA-C   25 mg at 06/15/16 2142  . ibuprofen (ADVIL,MOTRIN) tablet 800 mg  800 mg Oral TID BM PRN Laverle Hobby, PA-C   800 mg at 06/16/16 2132  . magnesium hydroxide (MILK OF MAGNESIA) suspension 30 mL  30 mL Oral Daily PRN Rozetta Nunnery, NP      . methocarbamol (ROBAXIN) tablet 500 mg  500 mg Oral Q8H PRN Laverle Hobby, PA-C      . multivitamin with minerals tablet 1 tablet  1 tablet Oral Daily Artist Beach, MD   1 tablet at 06/17/16 0836  . thiamine (VITAMIN B-1) tablet 100 mg  100 mg Oral Daily Artist Beach, MD   100 mg at 06/17/16 0836  . traZODone (DESYREL) tablet 50 mg  50 mg Oral QHS,MR X 1 Rozetta Nunnery, NP   50 mg at 06/16/16 2132  . venlafaxine XR (EFFEXOR-XR) 24 hr capsule 75 mg  75 mg Oral Q breakfast Jenne Campus, MD   75 mg at 06/17/16 6160   PTA Medications: Prescriptions Prior to Admission  Medication Sig Dispense Refill Last Dose  . Cetirizine HCl (ZYRTEC PO) Take by mouth.   06/11/2016 at Unknown time     Patient Stressors: Financial difficulties Health problems Legal issue Medication change or noncompliance Occupational concerns Substance abuse  Patient Strengths: Ability for insight Average or above average intelligence Capable of independent living Communication skills General fund of knowledge Motivation for treatment/growth Supportive family/friends  Treatment Modalities: Medication Management, Group therapy, Case management,  1 to 1 session with clinician, Psychoeducation, Recreational therapy.   Physician Treatment Plan for Primary Diagnosis: MDD recurrent, severe Long Term Goal(s): Improvement in symptoms so as ready for discharge Improvement in symptoms so as ready for discharge   Short Term Goals: Ability to identify changes in lifestyle to reduce recurrence of condition will improve Ability to disclose and discuss suicidal ideas Ability to identify and develop effective coping behaviors will improve Compliance with prescribed medications will improve Ability to maintain clinical measurements within normal limits will improve Compliance with prescribed medications will improve Ability to identify triggers associated with substance abuse/mental health issues will improve  Medication Management: Evaluate patient's response, side effects, and tolerance of medication regimen.  Therapeutic Interventions: 1 to 1 sessions, Unit Group sessions and Medication administration.  Evaluation of Outcomes: Met  Physician Treatment Plan for Secondary Diagnosis: Active Problems:   Severe recurrent major depression without psychotic features ([place])   Substance  induced mood disorder ([place])  Long Term Goal(s): Improvement in symptoms so as ready for discharge Improvement in symptoms so as ready for discharge   Short Term Goals: Ability to identify changes in lifestyle to reduce recurrence of condition will improve Ability to disclose and discuss suicidal ideas Ability to  identify and develop effective coping behaviors will improve Compliance with prescribed medications will improve Ability to maintain clinical measurements within normal limits will improve Compliance with prescribed medications will improve Ability to identify triggers associated with substance abuse/mental health issues will improve     Medication Management: Evaluate patient's response, side effects, and tolerance of medication regimen.  Therapeutic Interventions: 1 to 1 sessions, Unit Group sessions and Medication administration.  Evaluation of Outcomes: Met   RN Treatment Plan for Primary Diagnosis:MDD recurrent, severe Long Term Goal(s): Knowledge of disease and therapeutic regimen to maintain health will improve  Short Term Goals: Ability to remain free from injury will improve, Ability to verbalize feelings will improve and Ability to disclose and discuss suicidal ideas  Medication Management: RN will administer medications as ordered by provider, will assess and evaluate patient's response and provide education to patient for prescribed medication. RN will report any adverse and/or side effects to prescribing provider.  Therapeutic Interventions: 1 on 1 counseling sessions, Psychoeducation, Medication administration, Evaluate responses to treatment, Monitor vital signs and CBGs as ordered, Perform/monitor CIWA, COWS, AIMS and Fall Risk screenings as ordered, Perform wound care treatments as ordered.  Evaluation of Outcomes: Met   LCSW Treatment Plan for Primary Diagnosis:MDD recurrent, severe Long Term Goal(s): Safe transition to appropriate next level of care at discharge, Engage patient in therapeutic group addressing interpersonal concerns.  Short Term Goals: Engage patient in aftercare planning with referrals and resources, Facilitate patient progression through stages of change regarding substance use diagnoses and concerns and Identify triggers associated with mental  health/substance abuse issues  Therapeutic Interventions: Assess for all discharge needs, 1 to 1 time with Social worker, Explore available resources and support systems, Assess for adequacy in community support network, Educate family and significant other(s) on suicide prevention, Complete Psychosocial Assessment, Interpersonal group therapy.  Evaluation of Outcomes: Met   Progress in Treatment: Attending groups: Yes Participating in groups: Yes Taking medication as prescribed: Yes. Toleration medication: Yes. Family/Significant other contact made: SPE completed with pt's mother.  Patient understands diagnosis: Yes. Discussing patient identified problems/goals with staff: Yes. Medical problems stabilized or resolved: Yes. Denies suicidal/homicidal ideation: Yes per self report.   Issues/concerns per patient self-inventory: No. Other: n/a   New problem(s) identified: No, Describe:  n/a  New Short Term/Long Term Goal(s): detox; medication stabilization; development of comprehensive mental wellness/sobriety plan.   Discharge Plan or Barriers: Pt accepted to [facility] for Friday at 12pm/admitting MD; Dr. Tacy Learn. Accepted per Amy in admissions. Pt's mother will pick her up between 7am-8am tomorrow morning. MD/RN/NP/Treatment team made aware.   Reason for Continuation of Hospitalization: Medication management  Estimated Length of Stay: 1 day (pt scheduled for early discharge 7am on Friday).   Attendees: Patient: 06/17/2016 10:14 AM  Physician: Dr. Parke Poisson MD 06/17/2016 10:14 AM  Nursing: Tye Savoy RN 06/17/2016 10:14 AM  RN Care Manager: Lars Pinks CM 06/17/2016 10:14 AM  Social Worker: Press photographer, LCSW 06/17/2016 10:14 AM  Recreational Therapist: Rhunette Croft 06/17/2016 10:14 AM  Other: Lindell Spar NP; May Augustin NP 06/17/2016 10:14 AM  Other:  06/17/2016 10:14 AM  Other: 06/17/2016 10:14 AM    Scribe for Treatment Team: Kimber Relic Smart,  LCSW 06/17/2016 10:14 AM

## 2016-06-17 NOTE — Progress Notes (Signed)
D-Social worker has arranged for her to go to [facility] tomorrow am for substance use. She is happy with this disposition. She did not complete self inventory today. She is able to contract for safety. A-Support offered. Monitored for safety and medications as ordered.  R-She is attending groups as available. Isolative, but does brighten with interactions and is appropriate and pleasant. No complaints voiced.
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
## 2016-06-17 NOTE — Progress Notes (Signed)
D: Patient seen interacting and socializing well with peers on day room. Verbalizes no concern. Denies SI/HI, AH/VH at this time. Rated depression and anxiety 0/10 on both. No behavioral issues noted.  A: Staff offered support and encouragement as needed. Due med given as ordered. Routine safety checks maintained. Will continue to monitor patient. R: Patient remains safe on unit.
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
## 2016-06-18 MED ORDER — ARIPIPRAZOLE 2 MG PO TABS: 2.0000 mg | ORAL_TABLET | Freq: Every day | ORAL | 0 refills | Status: DC

## 2016-06-18 MED ORDER — VENLAFAXINE HCL ER 75 MG PO CP24: 75.0000 mg | ORAL_CAPSULE | Freq: Every day | ORAL | 0 refills | Status: DC

## 2016-06-18 MED ORDER — TRAZODONE HCL 50 MG PO TABS: 50.0000 mg | ORAL_TABLET | Freq: Every evening | ORAL | 0 refills | Status: DC | PRN

## 2016-06-18 NOTE — Progress Notes (Signed)
Data. Patient denies SI/HI/AVH.   Patient interacting well with staff and other patients. Affect bright, but patient reports being, "Worried that I will get to the treatment facility on time."  Action. Emotional support and encouragement offered. Education provided on medication, indications and side effect. Q 15 minute checks done for safety. Response. Safety on the unit maintained through 15 minute checks.  Medications taken as prescribed. Remained calm and appropriate through out shift.  Pt. discharged to lobby.  Belongings sheet reviewed and signed by pt. and all belongings sent home, including scripts. Paperwork reviewed and pt. able to verbalize understanding of education. Pt. in no current distress and ambulatory.
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
## 2016-06-18 NOTE — Progress Notes (Signed)
DAR Note: Per report, patient will be leaving between 7 am and 8 am but has not been officially discharged. Barbara Cower NP notified but cannot be able to do the necessary paper work. Patient informed of the situation and was receptive to that. Reports abdominal cramps. Denies SI/HI, AH/VH at this time. Will continue to monitor patient.

## 2016-11-05 ENCOUNTER — Emergency Department (HOSPITAL_COMMUNITY): Payer: Self-pay

## 2016-11-05 ENCOUNTER — Encounter (HOSPITAL_COMMUNITY): Payer: Self-pay | Admitting: Emergency Medicine

## 2016-11-05 ENCOUNTER — Emergency Department (HOSPITAL_COMMUNITY)
Admission: EM | Admit: 2016-11-05 | Discharge: 2016-11-06 | Disposition: A | Discharge status: Home / Self Care | Payer: Self-pay | Attending: Emergency Medicine | Admitting: Emergency Medicine

## 2016-11-05 DIAGNOSIS — Z79899 Other long term (current) drug therapy: Secondary | ICD-10-CM | POA: Insufficient documentation

## 2016-11-05 DIAGNOSIS — Y999 Unspecified external cause status: Secondary | ICD-10-CM | POA: Insufficient documentation

## 2016-11-05 DIAGNOSIS — J45909 Unspecified asthma, uncomplicated: Secondary | ICD-10-CM | POA: Insufficient documentation

## 2016-11-05 DIAGNOSIS — Y9389 Activity, other specified: Secondary | ICD-10-CM | POA: Insufficient documentation

## 2016-11-05 DIAGNOSIS — Z23 Encounter for immunization: Secondary | ICD-10-CM | POA: Insufficient documentation

## 2016-11-05 DIAGNOSIS — S0101XA Laceration without foreign body of scalp, initial encounter: Secondary | ICD-10-CM | POA: Insufficient documentation

## 2016-11-05 DIAGNOSIS — W540XXA Bitten by dog, initial encounter: Secondary | ICD-10-CM | POA: Insufficient documentation

## 2016-11-05 DIAGNOSIS — Z203 Contact with and (suspected) exposure to rabies: Secondary | ICD-10-CM | POA: Insufficient documentation

## 2016-11-05 DIAGNOSIS — E039 Hypothyroidism, unspecified: Secondary | ICD-10-CM | POA: Insufficient documentation

## 2016-11-05 DIAGNOSIS — Y929 Unspecified place or not applicable: Secondary | ICD-10-CM | POA: Insufficient documentation

## 2016-11-05 LAB — POC URINE PREG, ED: PREG TEST UR: NEGATIVE

## 2016-11-05 MED ORDER — IBUPROFEN 800 MG PO TABS
800.0000 mg | ORAL_TABLET | Freq: Once | ORAL | Status: AC
  Administered 2016-11-05: 800 mg via ORAL
  Filled 2016-11-05: qty 1

## 2016-11-05 MED ORDER — LORAZEPAM 1 MG PO TABS
1.0000 mg | ORAL_TABLET | Freq: Once | ORAL | Status: AC
  Administered 2016-11-05: 1 mg via ORAL
  Filled 2016-11-05: qty 1

## 2016-11-05 MED ORDER — AMOXICILLIN-POT CLAVULANATE 875-125 MG PO TABS
1.0000 | ORAL_TABLET | Freq: Once | ORAL | Status: AC
  Administered 2016-11-05: 1 via ORAL
  Filled 2016-11-05: qty 1

## 2016-11-05 MED ORDER — RABIES VACCINE, PCEC IM SUSR
1.0000 mL | Freq: Once | INTRAMUSCULAR | Status: AC
  Administered 2016-11-05: 1 mL via INTRAMUSCULAR
  Filled 2016-11-05: qty 1

## 2016-11-05 MED ORDER — RABIES IMMUNE GLOBULIN 150 UNIT/ML IM INJ
20.0000 [IU]/kg | INJECTION | Freq: Once | INTRAMUSCULAR | Status: AC
  Administered 2016-11-05: 1200 [IU] via INTRAMUSCULAR
  Filled 2016-11-05: qty 8

## 2016-11-05 MED ORDER — TETANUS-DIPHTH-ACELL PERTUSSIS 5-2.5-18.5 LF-MCG/0.5 IM SUSP: 0.5000 mL | Freq: Once | INTRAMUSCULAR | Status: DC

## 2016-11-05 NOTE — ED Triage Notes (Addendum)
Pt reports dog bites to R arm just now. Unknown rabies status of attacking dog - was a stray. Hyperventilating in triage - after further assessment worried about wifi not working. Wounds noted to R arm and head near [place], blood stains to legs and head but no other bites noted.

## 2016-11-05 NOTE — ED Provider Notes (Signed)
[facility]-EMERGENCY DEPT Provider Note   CSN: [id number] Arrival date & time: 11/05/16  2045     History   Chief Complaint Chief Complaint  Patient presents with  . Animal Bite    HPI Misty Jensen is a 31 y.o. female presenting with forearm injury after being attacked by 2 dogs while walking her dog. She explained that she was walking her dog on a leash when two stray dogs came out and attacked her dog. She attempted to break up the fight and sustained 4 puncture wounds from bites to her right forearm. She also has abrasions to her scalp. No other injuries, She did not fall, no head trauma or loss of consciousness. She does not know the immunization status of the dogs. She is up-to-date on tetanus status.  HPI  Past Medical History:  Diagnosis Date  . Anxiety   . Asthma   . Depression   . Drug abuse    THC, cocaine  . Hypothyroidism    patient reports "in past"  . Thyroid disease     Patient Active Problem List   Diagnosis Date Noted  . Substance induced mood disorder (HCC) 06/14/2016  . Severe recurrent major depression without psychotic features (HCC) 06/12/2016    History reviewed. No pertinent surgical history.  OB History    No data available       Home Medications    Prior to Admission medications   Medication Sig Start Date End Date Taking? Authorizing Provider  amoxicillin-clavulanate (AUGMENTIN) 875-125 MG tablet Take 1 tablet by mouth 2 (two) times daily. 11/06/16 11/11/16  Mathews Robinsons B, PA-C  ARIPiprazole (ABILIFY) 2 MG tablet Take 1 tablet (2 mg total) by mouth daily. 06/19/16   Truman Hayward, FNP  ibuprofen (ADVIL,MOTRIN) 800 MG tablet Take 1 tablet (800 mg total) by mouth 3 (three) times daily. 11/06/16   Georgiana Shore, PA-C  traZODone (DESYREL) 50 MG tablet Take 1 tablet (50 mg total) by mouth at bedtime and may repeat dose one time if needed. 06/18/16   Truman Hayward, FNP  venlafaxine XR (EFFEXOR-XR) 75 MG 24 hr capsule Take 1 capsule  (75 mg total) by mouth daily with breakfast. 06/19/16   Starkes, Juel Burrow, FNP    Family History History reviewed. No pertinent family history.  Social History Social History  Substance Use Topics  . Smoking status: Never Smoker  . Smokeless tobacco: Never Used  . Alcohol use Yes     Comment: occasional     Allergies   Codeine and Sulfa antibiotics   Review of Systems Review of Systems  Respiratory: Positive for shortness of breath. Negative for chest tightness, wheezing and stridor.   Cardiovascular: Negative for chest pain.  Gastrointestinal: Negative for nausea and vomiting.  Musculoskeletal: Positive for myalgias. Negative for arthralgias, back pain, gait problem, joint swelling, neck pain and neck stiffness.  Skin: Positive for wound. Negative for pallor.  Neurological: Positive for light-headedness. Negative for dizziness, syncope, speech difficulty, weakness, numbness and headaches.  Psychiatric/Behavioral: Positive for agitation. The patient is nervous/anxious.      Physical Exam Updated Vital Signs BP 114/79 (BP Location: Left Arm)   Pulse 86   Temp 98.4 F (36.9 C) (Oral)   Resp 18   Ht 5\' 6"  (1.676 m)   Wt 61.2 kg (135 lb)   LMP 09/12/2016 (Within Days)   SpO2 100%   BMI 21.79 kg/m   Physical Exam  Constitutional: She is oriented to person, place, and time. She  appears well-developed and well-nourished. No distress.  Anxious and hyperventilating, afebrile nontoxic  HENT:  Head: Normocephalic and atraumatic.    Patient with small abrasion on left parietal scalp suspicious for a tooth mark or claw. laceration to caudal parietal scalp.  Eyes: Pupils are equal, round, and reactive to light. Conjunctivae and EOM are normal.  Neck: Normal range of motion. Neck supple.  Cardiovascular: Normal rate, regular rhythm, normal heart sounds and intact distal pulses.   No murmur heard. Pulmonary/Chest: Effort normal and breath sounds normal. No respiratory distress.  She has no wheezes. She has no rales.  Abdominal: Soft. She exhibits no distension. There is no tenderness. There is no guarding.  Musculoskeletal: Normal range of motion. She exhibits tenderness. She exhibits no edema or deformity.       Arms: Tender to palpation of the right upper extremity between the elbow and the wrist where puncture wounds are located with surrounding edema and ecchymosis.  Neurological: She is alert and oriented to person, place, and time. No cranial nerve deficit or sensory deficit. She exhibits normal muscle tone.  Skin: Skin is warm and dry. No rash noted. She is not diaphoretic. No erythema. No pallor.  Psychiatric: She has a normal mood and affect.  Nursing note and vitals reviewed.    ED Treatments / Results  Labs (all labs ordered are listed, but only abnormal results are displayed) Labs Reviewed  POC URINE PREG, ED    EKG  EKG Interpretation None       Radiology Dg Forearm Right  Result Date: 11/05/2016 CLINICAL DATA:  Multiple right forearm lacerations following multiple dog bites tonight. EXAM: RIGHT FOREARM - 2 VIEW COMPARISON:  None. FINDINGS: Mild amount of soft tissue air and overlying bandage material. No fracture, dislocation or radiopaque foreign body. IMPRESSION: No fracture. Electronically Signed   By: Beckie Salts M.D.   On: 11/05/2016 23:32    Procedures Procedures (including critical care time) LACERATION REPAIR Performed by: Georgiana Shore Authorized by: Georgiana Shore Consent: Verbal consent obtained. Risks and benefits: risks, benefits and alternatives were discussed Consent given by: patient Patient identity confirmed: provided demographic data Prepped and Draped in normal sterile fashion Wound explored  Laceration Location: right parietal scalp  Laceration Length: 1.5cm  No Foreign Bodies seen or palpated  Anesthesia: local infiltration  Local anesthetic:LET  Anesthetic total: 3 ml  Irrigation method:  syringe Amount of cleaning: standard  Skin closure: staples  Number of sutures: 3  Technique: staples  Patient tolerance: Patient tolerated the procedure well with no immediate complications. Medications Ordered in ED Medications  Tdap (BOOSTRIX) injection 0.5 mL (0.5 mLs Intramuscular Not Given 11/05/16 2240)  lidocaine-EPINEPHrine-tetracaine (LET) solution (not administered)  ketorolac (TORADOL) injection 30 mg (not administered)  LORazepam (ATIVAN) tablet 1 mg (1 mg Oral Given 11/05/16 2239)  ibuprofen (ADVIL,MOTRIN) tablet 800 mg (800 mg Oral Given 11/05/16 2239)  rabies immune globulin (HYPERAB/KEDRAB) injection 1,200 Units (1,200 Units Intramuscular Given 11/05/16 2338)  rabies vaccine (RABAVERT) injection 1 mL (1 mL Intramuscular Given 11/05/16 2337)  amoxicillin-clavulanate (AUGMENTIN) 875-125 MG per tablet 1 tablet (1 tablet Oral Given 11/05/16 2239)     Initial Impression / Assessment and Plan / ED Course  I have reviewed the triage vital signs and the nursing notes.  Pertinent labs & imaging results that were available during my care of the patient were reviewed by me and considered in my medical decision making (see chart for details).    Patient presents with multiple  dog bites while attempting to break up a dog fight between her dog and two strays. Therefore unknown immunization status of attacking dogs.  Patient reports her tetanus immunization was up-to-date and refused vaccination in ED.  She was very anxious and hyperventilating. Had to spend some time with patient coaching her on breathing. She was on 2 L nasal cannula from triage. Her O2 sats were 100%. With coaching she calmed down and was breathing normally without shortness of breath.  Patient was given Ativan and observed. X-ray negative  On reassessment, patient was calm and reported significant improvement. On second examination, noted superficial scalp laceration.  Pressure irrigation performed. Wound  explored and base of wound visualized in a bloodless field without evidence of foreign body.  Laceration occurred < 8 hours prior to repair which was well tolerated. Tdap up to date. Pt discharged with antibiotics.   Discussed staple home care with patient and answered questions. Pt to follow-up for wound check and suture removal in 7-10 days; they are to return to the ED sooner for signs of infection. Pt is hemodynamically stable with no complaints prior to dc.   Discussed strict return precautions and advised to return to the emergency department if experiencing any new or worsening symptoms. Instructions were understood and patient agreed with discharge plan.  Final Clinical Impressions(s) / ED Diagnoses   Final diagnoses:  Dog bite, initial encounter  Laceration of scalp without foreign body, initial encounter    New Prescriptions New Prescriptions   AMOXICILLIN-CLAVULANATE (AUGMENTIN) 875-125 MG TABLET    Take 1 tablet by mouth 2 (two) times daily.   IBUPROFEN (ADVIL,MOTRIN) 800 MG TABLET    Take 1 tablet (800 mg total) by mouth 3 (three) times daily.     Georgiana Shore, PA-C 11/06/16 0141    Tegeler, Canary Brim, MD 11/06/16 1116

## 2016-11-05 NOTE — ED Notes (Signed)
Patient's family back up to desk.  Cursing staff as she walks back to patient who I had informed while she was at desk speaking to [place], [place] about rooming.

## 2016-11-05 NOTE — ED Notes (Addendum)
Patient's family member up to desk.  States patient is "dripping blood everywhere" and "can I get some wipes to clean her up".  This RN reassured her that we would do wound care once she got back to room.  Went to view patient, no dripping blood noted.  Patient apologizing for visitor's tone of voice.  Patient states she is in a lot of pain.   Patient also states she is allergic to pain medication.  This RN attempted to reassure patient/visitor, visitor speaking shortly to this RN and states "I've been here before, I know how this place works".  This RN returned to desk.

## 2016-11-05 NOTE — ED Notes (Signed)
Patient returned from xray.

## 2016-11-05 NOTE — ED Notes (Signed)
Patient transported to X-ray 

## 2016-11-06 MED ORDER — LIDOCAINE-EPINEPHRINE-TETRACAINE (LET) SOLUTION
3.0000 mL | Freq: Once | NASAL | Status: AC
  Administered 2016-11-06: 3 mL via TOPICAL
  Filled 2016-11-06: qty 3

## 2016-11-06 MED ORDER — IBUPROFEN 800 MG PO TABS: 800.0000 mg | ORAL_TABLET | Freq: Three times a day (TID) | ORAL | 0 refills | Status: DC

## 2016-11-06 MED ORDER — KETOROLAC TROMETHAMINE 60 MG/2ML IM SOLN
30.0000 mg | Freq: Once | INTRAMUSCULAR | Status: AC
  Administered 2016-11-06: 30 mg via INTRAMUSCULAR
  Filled 2016-11-06: qty 2

## 2016-11-06 MED ORDER — AMOXICILLIN-POT CLAVULANATE 875-125 MG PO TABS: 1.0000 | ORAL_TABLET | Freq: Two times a day (BID) | ORAL | 0 refills | Status: DC

## 2016-11-06 NOTE — Discharge Instructions (Signed)
As discussed, keep the area clean and change bandages daily. Make sure to drink plenty of fluids and stay well-hydrated.  Take Augmentin for the next 5 days. Follow-up with your primary care provider in 48 hours for a wound recheck and follow-up for your rabies vaccination at day 3, 7, 14.  Staples will need to be removed in 7-10 days. Staples can be removed in clinic, urgent care or emergency department  Monitor for any signs of infection and be seen immediately if the wound becomes increasingly swollen, red, warm to touch, he developed a fever, chills, nausea, vomiting or any other concerning symptoms in the meantime

## 2016-11-08 ENCOUNTER — Emergency Department (HOSPITAL_COMMUNITY)
Admission: EM | Admit: 2016-11-08 | Discharge: 2016-11-09 | Disposition: A | Discharge status: Other Acute Inpatient Hospital | Payer: BLUE CROSS/BLUE SHIELD | Attending: Emergency Medicine | Admitting: Emergency Medicine

## 2016-11-08 DIAGNOSIS — E039 Hypothyroidism, unspecified: Secondary | ICD-10-CM | POA: Insufficient documentation

## 2016-11-08 DIAGNOSIS — F329 Major depressive disorder, single episode, unspecified: Secondary | ICD-10-CM | POA: Insufficient documentation

## 2016-11-08 DIAGNOSIS — Z79899 Other long term (current) drug therapy: Secondary | ICD-10-CM | POA: Insufficient documentation

## 2016-11-08 DIAGNOSIS — J45909 Unspecified asthma, uncomplicated: Secondary | ICD-10-CM | POA: Insufficient documentation

## 2016-11-08 DIAGNOSIS — F1920 Other psychoactive substance dependence, uncomplicated: Secondary | ICD-10-CM

## 2016-11-08 DIAGNOSIS — F332 Major depressive disorder, recurrent severe without psychotic features: Secondary | ICD-10-CM | POA: Diagnosis present

## 2016-11-08 DIAGNOSIS — F32A Depression, unspecified: Secondary | ICD-10-CM

## 2016-11-08 LAB — COMPREHENSIVE METABOLIC PANEL
ALBUMIN: 4.3 g/dL (ref 3.5–5.0)
ALK PHOS: 68 U/L (ref 38–126)
ALT: 16 U/L (ref 14–54)
AST: 23 U/L (ref 15–41)
Anion gap: 11 (ref 5–15)
BUN: 12 mg/dL (ref 6–20)
CHLORIDE: 104 mmol/L (ref 101–111)
CO2: 24 mmol/L (ref 22–32)
CREATININE: 0.88 mg/dL (ref 0.44–1.00)
Calcium: 9.2 mg/dL (ref 8.9–10.3)
GFR calc Af Amer: >60 mL/min (ref >60)
GFR calc non Af Amer: >60 mL/min (ref >60)
GLUCOSE: 86 mg/dL (ref 65–99)
Potassium: 3.3 mmol/L — ABNORMAL LOW (ref 3.5–5.1)
SODIUM: 139 mmol/L (ref 135–145)
Total Bilirubin: 0.7 mg/dL (ref 0.3–1.2)
Total Protein: 7.8 g/dL (ref 6.5–8.1)

## 2016-11-08 LAB — CBC
HEMATOCRIT: 41.2 % (ref 36.0–46.0)
Hemoglobin: 14.4 g/dL (ref 12.0–15.0)
MCH: 30.7 pg (ref 26.0–34.0)
MCHC: 35 g/dL (ref 30.0–36.0)
MCV: 87.8 fL (ref 78.0–100.0)
Platelets: 360 10*3/uL (ref 150–400)
RBC: 4.69 MIL/uL (ref 3.87–5.11)
RDW: 12.6 % (ref 11.5–15.5)
WBC: 8.1 10*3/uL (ref 4.0–10.5)

## 2016-11-08 LAB — RAPID URINE DRUG SCREEN, HOSP PERFORMED
AMPHETAMINES: POSITIVE — AB
BARBITURATES: NOT DETECTED
BENZODIAZEPINES: POSITIVE — AB
Cocaine: POSITIVE — AB
Opiates: NOT DETECTED
Tetrahydrocannabinol: POSITIVE — AB

## 2016-11-08 LAB — ETHANOL: Alcohol, Ethyl (B): <5 mg/dL (ref <5)

## 2016-11-08 NOTE — ED Provider Notes (Signed)
[facility] Provider Note   CSN: [id number] Arrival date & time: 11/08/16  1734     History   Chief Complaint Chief Complaint  Patient presents with  . Medical Clearance    HPI Misty Jensen is a 31 y.o. female.  Patient is a 31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 who presents with IVC papers. IVC appears were initiated by her mother. He states the patient has a history of bipolar disorder and has not been taking care of her self. They state that she has substance abuse issues and lost her job. She has no electricity at home and is unable to care for herself. She recently had a dog bite 3 days ago and the papers state that she's not been cleansing these areas. Mother is concerned that she is a danger to herself. Patient denies any complaints. She does note that her electricity is been turned off because she's having financial issues. She states her her mother does not approve of the weighted she's living. She states that she has had prior drug use but the only thing that she admits to recently is marijuana. She denies any alcohol use. She denies any suicidal or homicidal ideations.      Past Medical History:  Diagnosis Date  . Anxiety   . Asthma   . Depression   . Drug abuse    THC, cocaine  . Hypothyroidism    patient reports "in past"  . Thyroid disease     Patient Active Problem List   Diagnosis Date Noted  . Substance induced mood disorder (HCC) 06/14/2016  . Severe recurrent major depression without psychotic features (HCC) 06/12/2016    No past surgical history on file.  OB History    No data available       Home Medications    Prior to Admission medications   Medication Sig Start Date End Date Taking? Authorizing Provider  ARIPiprazole (ABILIFY) 2 MG tablet Take 1 tablet (2 mg total) by mouth daily. 06/19/16  Yes Starkes, Juel Burrow, FNP  cetirizine (ZYRTEC) 10 MG tablet Take 10 mg by mouth daily as needed for allergies.   Yes [provider]  traZODone  (DESYREL) 50 MG tablet Take 1 tablet (50 mg total) by mouth at bedtime and may repeat dose one time if needed. Patient taking differently: Take 50 mg by mouth at bedtime.  06/18/16  Yes Truman Hayward, FNP  venlafaxine XR (EFFEXOR-XR) 75 MG 24 hr capsule Take 1 capsule (75 mg total) by mouth daily with breakfast. 06/19/16  Yes Starkes, Juel Burrow, FNP  amoxicillin-clavulanate (AUGMENTIN) 875-125 MG tablet Take 1 tablet by mouth 2 (two) times daily. 11/06/16 11/11/16  Mathews Robinsons B, PA-C  ibuprofen (ADVIL,MOTRIN) 800 MG tablet Take 1 tablet (800 mg total) by mouth 3 (three) times daily. 11/06/16   Georgiana Shore, PA-C    Family History No family history on file.  Social History Social History  Substance Use Topics  . Smoking status: Never Smoker  . Smokeless tobacco: Never Used  . Alcohol use Yes     Comment: occasional     Allergies   Codeine and Sulfa antibiotics   Review of Systems Review of Systems  Constitutional: Negative for chills, diaphoresis, fatigue and fever.  HENT: Negative for congestion, rhinorrhea and sneezing.   Eyes: Negative.   Respiratory: Negative for cough, chest tightness and shortness of breath.   Cardiovascular: Negative for chest pain and leg swelling.  Gastrointestinal: Negative for abdominal pain, blood in stool, diarrhea, nausea and  vomiting.  Genitourinary: Negative for difficulty urinating, flank pain, frequency and hematuria.  Musculoskeletal: Negative for arthralgias and back pain.  Skin: Positive for wound. Negative for rash.  Neurological: Negative for dizziness, speech difficulty, weakness, numbness and headaches.     Physical Exam Updated Vital Signs BP 98/76 (BP Location: Left Arm)   Pulse 98   Temp 98 F (36.7 C) (Oral)   Resp 18   Ht 5\' 6"  (1.676 m)   Wt 61.2 kg (135 lb)   LMP 10/12/2016   SpO2 94%   BMI 21.79 kg/m   Physical Exam  Constitutional: She is oriented to person, place, and time. She appears well-developed and  well-nourished.  HENT:  Head: Normocephalic and atraumatic.  Eyes: Pupils are equal, round, and reactive to light.  Neck: Normal range of motion. Neck supple.  Cardiovascular: Normal rate, regular rhythm and normal heart sounds.   Pulmonary/Chest: Effort normal and breath sounds normal. No respiratory distress. She has no wheezes. She has no rales. She exhibits no tenderness.  Abdominal: Soft. Bowel sounds are normal. There is no tenderness. There is no rebound and no guarding.  Musculoskeletal: Normal range of motion. She exhibits no edema.  Lymphadenopathy:    She has no cervical adenopathy.  Neurological: She is alert and oriented to person, place, and time.  Skin: Skin is warm and dry. No rash noted.  Patient has some small puncture wounds to her right forearm from a prior dog bite. They appeared to be healing well. There is no signs of infection. Her scalp wound appears to be healing well without signs of infection. Staples are intact to the scalp wound.  Psychiatric: She has a normal mood and affect.     ED Treatments / Results  Labs (all labs ordered are listed, but only abnormal results are displayed) Labs Reviewed  COMPREHENSIVE METABOLIC PANEL - Abnormal; Notable for the following:       Result Value   Potassium 3.3 (*)    All other components within normal limits  RAPID URINE DRUG SCREEN, HOSP PERFORMED - Abnormal; Notable for the following:    Cocaine POSITIVE (*)    Benzodiazepines POSITIVE (*)    Amphetamines POSITIVE (*)    Tetrahydrocannabinol POSITIVE (*)    All other components within normal limits  ETHANOL  CBC  HCG, QUANTITATIVE, PREGNANCY    EKG  EKG Interpretation None       Radiology No results found.  Procedures Procedures (including critical care time)  Medications Ordered in ED Medications - No data to display   Initial Impression / Assessment and Plan / ED Course  I have reviewed the triage vital signs and the nursing  notes.  Pertinent labs & imaging results that were available during my care of the patient were reviewed by me and considered in my medical decision making (see chart for details).     Patient was evaluated by TTS and recommended for inpatient psychiatric treatment. She is medically cleared. She is awaiting placement.  Final Clinical Impressions(s) / ED Diagnoses   Final diagnoses:  Depression, unspecified depression type    New Prescriptions New Prescriptions   No medications on file     Rolan Bucco, MD 11/08/16 2250

## 2016-11-08 NOTE — ED Notes (Signed)
Bed: WA26 Expected date:  Expected time:  Means of arrival:  Comments: [employer]-vol 

## 2016-11-08 NOTE — ED Triage Notes (Signed)
Patient states "my mother does not like the way I am living". Patient experience a dog bite three days ago and received treatment at [facility]. Right arm is wrapped in gauze and Ace wrap and staples in back of head from recent dog bite. Patient states she has no electronic power nor water.

## 2016-11-08 NOTE — ED Notes (Signed)
Patient resting on bed calm and cooperative

## 2016-11-08 NOTE — BH Assessment (Addendum)
Assessment Note  Misty Jensen is an 31 y.o. female, who presents involuntary and unaccompanied to [facility].  Clinician asked the pt, "what brought you to the hospital?" Pt replied, "my mom committed me, she has nothing to do with me, she doesn't own my house, she thinks I'm not living right." Pt continued, "she thinks I'm a danger to myself, I can't afford to pay electric bill this month, I don't have any power." Pt reported, on Saturday (11/06/2016), as she walked her dog, she was attacked by two pit bulls. Pt reported, she couldn't get her boyfriend so she called her mother. Pt reported, in April 2018, she attempted suicide by trying to asphyxiate herself with a belt. Pt reported, being stressed. Pt denies current, SI, HI, AVH, self-injurious behaviors and access to weapons.   Pt did not give clinician consent to speak to her mother who initialed her IVC paperwork. Per IVC paperwork: "Petitioner states: The respondent has been diagnosed with Bipolar Disorder, Depression and Anxiety. The respondent has been abusing drugs including crack cocaine and marijuana. The respondent has not been eating or taking care if her personal hygiene, she has lost over 50 pounds since December. 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 On August 25, the respondent was attacked by 2 pit bulls living in her home and although she did seek medical attention from the ER, she went back to her house which has no running water or electricity. The respondent is unable to take care of the multiple wound from the dog bites in the condition she is currently living in. Today the respondent told petitioner she has not bathed in a could of days. The respondent is in need of intervention."   Pt denied abuse. Pt reported, smoking marijuana, a week ago, and denied other substance use. Pt's BAL is positive for, marijuana, cocaine, amphetamines and benzodiazepines. Pt denied being linked to OPT  resources (medication management and/or counseling.) Pt reported, her primary care physician prescribes her psychotropic medications. Pt reports, taking her medications as prescribed. Pt reported, previous inpatient admissions to [facility] in April 2018. Pt reported, after she was discharged from [facility], she went to [facility] for an additional fourteen days.   Pt present quiet/awake in scrubs, with logical/coherent speech. Pt's eye contact was good. Pt's mood was sad, irritable. Pt's affect was congruent with mood. Pt's thought process was relevant/coherent. Pt's judgement was unimpaired. Pt's concentration was normal. Pt's insight was fair. Pt's impulse control was poor. Pt was oriented x4 (day, year, city and state.) Pt reported, if discharged from [facility] she could contract for safety.   Diagnosis: Major Depressive Disorder recurrent severe, without Psychotic Features.                      Cocaine use disorder, Severe.                     Cannabis use disorder, Severe.                     Amphetamine-type substance use disorder, Severe.  Past Medical History:  Past Medical History:  Diagnosis Date  . Anxiety   . Asthma   . Depression   . Drug abuse    THC, cocaine  . Hypothyroidism    patient reports "in past"  . Thyroid disease     No past surgical history on file.  Family History: No family history  on file.  Social History:  reports that she has never smoked. She has never used smokeless tobacco. She reports that she drinks alcohol. She reports that she uses drugs, including Cocaine, Marijuana, Benzodiazepines, "Crack" cocaine, and MDMA (Ecstacy).  Additional Social History:  Alcohol / Drug Use Pain Medications: See MAR Prescriptions: See MAR Over the Counter: See MAR History of alcohol / drug use?: Yes Negative Consequences of Use: Financial Substance #1 Name of Substance 1: Marijuana 1 - Age of First Use: UTA  1 - Amount (size/oz): Pt reported, she last used marijuana, last  week.  1 - Frequency: UTA 1 - Duration: UTA 1 - Last Use / Amount: Pt reported, last week.  Substance #2 Name of Substance 2: Cocaine 2 - Age of First Use: UTA 2 - Amount (size/oz): Pt denies, use however her UDS is positive for Cocaine.  2 - Frequency: UTA 2 - Duration: UTA 2 - Last Use / Amount: UTA Substance #3 Name of Substance 3: Amphetamines. 3 - Age of First Use: UTA 3 - Amount (size/oz): Pt denies, use however her UDS is positive for Amphetamines.  3 - Frequency: UTA 3 - Duration: UTA 3 - Last Use / Amount: UTA Substance #4 Name of Substance 4: Benzodiazepines. 4 - Age of First Use: UTA 4 - Amount (size/oz): Pt denies, use however her UDS is positive for Benzodiazepines.  4 - Frequency: UTA 4 - Duration: UTA 4 - Last Use / Amount: UTA  CIWA: CIWA-Ar BP: 115/85 Pulse Rate: 89 COWS:    Allergies:  Allergies  Allergen Reactions  . Codeine Anaphylaxis  . Sulfa Antibiotics Anaphylaxis    Home Medications:  (Not in a hospital admission)  OB/GYN Status:  Patient's last menstrual period was 10/12/2016.  General Assessment Data Location of Assessment: [facility] TTS Assessment: In system Is this a Tele or Face-to-Face Assessment?: Face-to-Face Is this an Initial Assessment or a Re-assessment for this encounter?: Initial Assessment Marital status: Single Living Arrangements: Spouse/significant other Can pt return to current living arrangement?: Yes Admission Status: Involuntary Referral Source: Self/Family/Friend Insurance type: BCBS     Crisis Care Plan Living Arrangements: Spouse/significant other Legal Guardian: Other: (Self) Name of Psychiatrist: NA Name of Therapist: NA  Education Status Is patient currently in school?: No Current Grade: NA Highest grade of school patient has completed: Pt reported, two Bachelor degrees in Nursing.  Name of school: NA Contact person: NA  Risk to self with the past 6 months Suicidal Ideation: No-Not Currently/Within  Last 6 Months (Pt denies. ) Has patient been a risk to self within the past 6 months prior to admission? : Yes Suicidal Intent: No-Not Currently/Within Last 6 Months Has patient had any suicidal intent within the past 6 months prior to admission? : Yes Is patient at risk for suicide?: No Suicidal Plan?: No-Not Currently/Within Last 6 Months Has patient had any suicidal plan within the past 6 months prior to admission? : Yes Access to Means:  (Pt denies. ) What has been your use of drugs/alcohol within the last 12 months?: Cocaine, marijuana, amphetamines and benzodiazepines.  Previous Attempts/Gestures: Yes How many times?: 2 Other Self Harm Risks: Pt denies.  Triggers for Past Attempts: Unknown Intentional Self Injurious Behavior: None (Pt denies.) Family Suicide History: No Recent stressful life event(s): Financial Problems, Job Loss Persecutory voices/beliefs?: No Depression: Yes Depression Symptoms: Feeling worthless/self pity, Guilt, Fatigue, Feeling angry/irritable Substance abuse history and/or treatment for substance abuse?: Yes Suicide prevention information given to non-admitted patients: Not applicable  Risk to Others within the past 6 months Homicidal Ideation: No (Pt denies. ) Does patient have any lifetime risk of violence toward others beyond the six months prior to admission? : No Thoughts of Harm to Others: No Current Homicidal Intent: No Current Homicidal Plan: No Access to Homicidal Means: No Identified Victim: NA History of harm to others?: No Assessment of Violence: None Noted Violent Behavior Description: NA Does patient have access to weapons?: No (Pt denies. ) Criminal Charges Pending?: No Does patient have a court date: No Is patient on probation?: No  Psychosis Hallucinations: None noted Delusions: None noted  Mental Status Report Appearance/Hygiene: In scrubs Eye Contact: Good Motor Activity: Unremarkable Speech: Logical/coherent Level of  Consciousness: Quiet/awake Mood: Sad, Irritable Affect: Other (Comment) (congruent with mood. ) Anxiety Level: None Thought Processes: Relevant, Coherent Judgement: Unimpaired Orientation: Other (Comment) (day, year, city and state. ) Obsessive Compulsive Thoughts/Behaviors: None  Cognitive Functioning Concentration: Normal Memory: Recent Intact IQ: Average Insight: Fair Impulse Control: Poor Appetite: Fair Sleep: Decreased Total Hours of Sleep: 6 Vegetative Symptoms: None  ADLScreening [facility] Assessment Services) Patient's cognitive ability adequate to safely complete daily activities?: Yes Patient able to express need for assistance with ADLs?: Yes Independently performs ADLs?: Yes (appropriate for developmental age)  Prior Inpatient Therapy Prior Inpatient Therapy: Yes Prior Therapy Dates: April 2018 Prior Therapy Facilty/Provider(s): [facility] Reason for Treatment: SI, suicide attempt, subtance use.   Prior Outpatient Therapy Prior Outpatient Therapy: No Prior Therapy Dates: NA Prior Therapy Facilty/Provider(s): NA Reason for Treatment: NA Does patient have an ACCT team?: No Does patient have Intensive In-House Services?  : No Does patient have [facility] services? : No Does patient have P4CC services?: No  ADL Screening (condition at time of admission) Patient's cognitive ability adequate to safely complete daily activities?: Yes Is the patient deaf or have difficulty hearing?: No Does the patient have difficulty concentrating, remembering, or making decisions?: Yes (Pt reported, due to stress. ) Patient able to express need for assistance with ADLs?: Yes Does the patient have difficulty dressing or bathing?: No Independently performs ADLs?: Yes (appropriate for developmental age) Does the patient have difficulty walking or climbing stairs?: No Weakness of Legs: None Weakness of Arms/Hands: None       Abuse/Neglect Assessment (Assessment to be complete while  patient is alone) Physical Abuse: Denies (Pt denies. ) Verbal Abuse: Denies (Pt denies. ) Sexual Abuse: Denies (Pt denies. ) Exploitation of patient/patient's resources: Denies (Pt denies. ) Self-Neglect: Denies (Pt denies.)     Advance Directives (For Healthcare) Does Patient Have a Medical Advance Directive?: No Would patient like information on creating a medical advance directive?: No - Patient declined    Additional Information 1:1 In Past 12 Months?: No CIRT Risk: No Elopement Risk: No Does patient have medical clearance?: Yes     Disposition: Donell Sievert, PA recommends inpatient treatment. Disposition discussed with Dr. Fredderick Phenix and Juliette Alcide, RN. TTS to seek placement.    Disposition Initial Assessment Completed for this Encounter: Yes Disposition of Patient: Inpatient treatment program Type of inpatient treatment program: Adult  On Site Evaluation by:   Reviewed with Physician:  Dr. Fredderick Phenix and Donell Sievert, PA.  Redmond Pulling 11/08/2016 8:35 PM   Redmond Pulling, MS, [facility], [facility] Triage Specialist [phone]

## 2016-11-08 NOTE — ED Notes (Signed)
Pt mother called, pt stated that she did not want any information given to her mother. Misty Jensen

## 2016-11-09 ENCOUNTER — Inpatient Hospital Stay (HOSPITAL_COMMUNITY)
Admission: RE | Admit: 2016-11-09 | Discharge: 2016-11-13 | DRG: 897 | Disposition: A | Discharge status: Home / Self Care | Payer: Federal, State, Local not specified - Other | Source: Intra-hospital | Attending: Psychiatry | Admitting: Psychiatry

## 2016-11-09 ENCOUNTER — Encounter (HOSPITAL_COMMUNITY): Payer: Self-pay

## 2016-11-09 DIAGNOSIS — Z9114 Patient's other noncompliance with medication regimen: Secondary | ICD-10-CM | POA: Diagnosis not present

## 2016-11-09 DIAGNOSIS — F332 Major depressive disorder, recurrent severe without psychotic features: Secondary | ICD-10-CM | POA: Diagnosis not present

## 2016-11-09 DIAGNOSIS — F419 Anxiety disorder, unspecified: Secondary | ICD-10-CM | POA: Diagnosis present

## 2016-11-09 DIAGNOSIS — R5381 Other malaise: Secondary | ICD-10-CM | POA: Diagnosis not present

## 2016-11-09 DIAGNOSIS — G4700 Insomnia, unspecified: Secondary | ICD-10-CM | POA: Diagnosis present

## 2016-11-09 DIAGNOSIS — F1210 Cannabis abuse, uncomplicated: Secondary | ICD-10-CM | POA: Diagnosis not present

## 2016-11-09 DIAGNOSIS — R450 Nervousness: Secondary | ICD-10-CM | POA: Diagnosis not present

## 2016-11-09 DIAGNOSIS — R5383 Other fatigue: Secondary | ICD-10-CM | POA: Diagnosis not present

## 2016-11-09 DIAGNOSIS — J45909 Unspecified asthma, uncomplicated: Secondary | ICD-10-CM | POA: Diagnosis present

## 2016-11-09 DIAGNOSIS — Z885 Allergy status to narcotic agent status: Secondary | ICD-10-CM | POA: Diagnosis not present

## 2016-11-09 DIAGNOSIS — F329 Major depressive disorder, single episode, unspecified: Secondary | ICD-10-CM | POA: Diagnosis present

## 2016-11-09 DIAGNOSIS — F1994 Other psychoactive substance use, unspecified with psychoactive substance-induced mood disorder: Secondary | ICD-10-CM | POA: Diagnosis not present

## 2016-11-09 DIAGNOSIS — F1920 Other psychoactive substance dependence, uncomplicated: Secondary | ICD-10-CM

## 2016-11-09 DIAGNOSIS — Z882 Allergy status to sulfonamides status: Secondary | ICD-10-CM

## 2016-11-09 DIAGNOSIS — Z599 Problem related to housing and economic circumstances, unspecified: Secondary | ICD-10-CM | POA: Diagnosis not present

## 2016-11-09 DIAGNOSIS — F4010 Social phobia, unspecified: Secondary | ICD-10-CM | POA: Diagnosis not present

## 2016-11-09 DIAGNOSIS — R634 Abnormal weight loss: Secondary | ICD-10-CM | POA: Diagnosis not present

## 2016-11-09 MED ORDER — ALUM & MAG HYDROXIDE-SIMETH 200-200-20 MG/5ML PO SUSP: 30.0000 mL | ORAL | Status: DC | PRN

## 2016-11-09 MED ORDER — MAGNESIUM HYDROXIDE 400 MG/5ML PO SUSP: 30.0000 mL | Freq: Every day | ORAL | Status: DC | PRN

## 2016-11-09 MED ORDER — LORATADINE 10 MG PO TABS
10.0000 mg | ORAL_TABLET | Freq: Every day | ORAL | Status: DC
  Administered 2016-11-09 – 2016-11-13 (×5): 10 mg via ORAL
  Filled 2016-11-09 (×8): qty 1

## 2016-11-09 MED ORDER — ACETAMINOPHEN 325 MG PO TABS
650.0000 mg | ORAL_TABLET | Freq: Four times a day (QID) | ORAL | Status: DC | PRN
  Administered 2016-11-10 – 2016-11-11 (×3): 650 mg via ORAL
  Filled 2016-11-09 (×2): qty 2

## 2016-11-09 MED ORDER — ARIPIPRAZOLE 2 MG PO TABS
2.0000 mg | ORAL_TABLET | Freq: Every day | ORAL | Status: DC
  Administered 2016-11-09 – 2016-11-13 (×5): 2 mg via ORAL
  Filled 2016-11-09 (×2): qty 1
  Filled 2016-11-09: qty 7
  Filled 2016-11-09 (×2): qty 1
  Filled 2016-11-09: qty 7
  Filled 2016-11-09 (×3): qty 1

## 2016-11-09 MED ORDER — TRAZODONE HCL 50 MG PO TABS
50.0000 mg | ORAL_TABLET | Freq: Every evening | ORAL | Status: DC | PRN
  Administered 2016-11-09 – 2016-11-12 (×4): 50 mg via ORAL
  Filled 2016-11-09 (×12): qty 1

## 2016-11-09 NOTE — ED Notes (Signed)
Patient is sleeping  

## 2016-11-09 NOTE — Progress Notes (Signed)
Pt accepted to [facility] 305-1 on 11/09/16. Day shift AC to coordinate transfer time. Attending will be Dr. Jama Flavors, MD. Call to report [phone]. Juliette Alcide, RN in [place] notified.   Princess Bruins, MSW, LCSWA TTS Specialist [phone]

## 2016-11-09 NOTE — Progress Notes (Signed)
Pt did not attend AA meeting this evening.  

## 2016-11-09 NOTE — Tx Team (Signed)
Initial Treatment Plan 11/09/2016 4:51 PM Misty Jensen ZTI:[id number]    PATIENT STRESSORS: Financial difficulties Marital or family conflict Substance abuse   PATIENT STRENGTHS: Wellsite geologist fund of knowledge Physical Health   PATIENT IDENTIFIED PROBLEMS: Depression  Substance abuse  "Get out"  "Get back to my life"               DISCHARGE CRITERIA:  Improved stabilization in mood, thinking, and/or behavior Verbal commitment to aftercare and medication compliance  PRELIMINARY DISCHARGE PLAN: Outpatient therapy Medication management  PATIENT/FAMILY INVOLVEMENT: This treatment plan has been presented to and reviewed with the patient, Misty Jensen.  The patient and family have been given the opportunity to ask questions and make suggestions.  Levin Bacon, RN 11/09/2016, 4:51 PM

## 2016-11-09 NOTE — Progress Notes (Signed)
Misty Jensen is a 31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 being admitted voluntarily to 305-1 from [facility].  She came to the ED under IVC initiated by her mother.  Per IVC, she has not been taking care of her personal needs, lost 50 pounds over the last 6 months, living in house with no water/electricity and substance abuse.  Her current stressors are money problems, substance abuse and recent dog attack.  She has a history of suicide attempts in the past and most recent was April 2018.  She is diagnosed with Major Depressive Disorder, Cocaine use disorder, Cannabis use disorder and Amphetamine-type substance use disorder.  Pt denies SI/HI or A/V hallucinations.  She reported that her mother is the cause for this hospitalizations.  She denies any SA and even stated that it has been over a week since she used anything (even after being told UDS was positive for cocaine, cannabis and amphetamines).  Oriented her to the unit.  Admission paperwork completed and signed.  Belongings searched and no belongings as none came with her from the ED.  Misty Jensen called [facility] for them to find her belongings.  Skin assessment completed and noted 4 puncture wounds from bites to her right forearm and lacerations to scalp(lower/back of head with 3 staples intact).  Q 15 minute checks initiated for safety.  We will monitor the progress towards his/her goals.

## 2016-11-09 NOTE — ED Notes (Addendum)
Unable to locate clothes, gym pants, and a cell phone. Notified Conservation officer, historic buildings and security. [employer] here and  patient transferred to [facility].

## 2016-11-10 DIAGNOSIS — F332 Major depressive disorder, recurrent severe without psychotic features: Secondary | ICD-10-CM

## 2016-11-10 DIAGNOSIS — F1994 Other psychoactive substance use, unspecified with psychoactive substance-induced mood disorder: Principal | ICD-10-CM

## 2016-11-10 DIAGNOSIS — Z9114 Patient's other noncompliance with medication regimen: Secondary | ICD-10-CM

## 2016-11-10 DIAGNOSIS — Z599 Problem related to housing and economic circumstances, unspecified: Secondary | ICD-10-CM

## 2016-11-10 DIAGNOSIS — F419 Anxiety disorder, unspecified: Secondary | ICD-10-CM

## 2016-11-10 DIAGNOSIS — R5383 Other fatigue: Secondary | ICD-10-CM

## 2016-11-10 DIAGNOSIS — F4010 Social phobia, unspecified: Secondary | ICD-10-CM

## 2016-11-10 DIAGNOSIS — R450 Nervousness: Secondary | ICD-10-CM

## 2016-11-10 DIAGNOSIS — F1210 Cannabis abuse, uncomplicated: Secondary | ICD-10-CM

## 2016-11-10 DIAGNOSIS — R634 Abnormal weight loss: Secondary | ICD-10-CM

## 2016-11-10 DIAGNOSIS — G4700 Insomnia, unspecified: Secondary | ICD-10-CM

## 2016-11-10 DIAGNOSIS — R5381 Other malaise: Secondary | ICD-10-CM

## 2016-11-10 LAB — LIPID PANEL
CHOLESTEROL: 149 mg/dL (ref 0–200)
HDL: 42 mg/dL (ref >40)
LDL Cholesterol: 76 mg/dL (ref 0–99)
Total CHOL/HDL Ratio: 3.5 RATIO
Triglycerides: 153 mg/dL — ABNORMAL HIGH (ref <150)
VLDL: 31 mg/dL (ref 0–40)

## 2016-11-10 LAB — HEMOGLOBIN A1C
Hgb A1c MFr Bld: 4.9 % (ref 4.8–5.6)
MEAN PLASMA GLUCOSE: 93.93 mg/dL

## 2016-11-10 LAB — TSH: TSH: 2.19 u[IU]/mL (ref 0.350–4.500)

## 2016-11-10 MED ORDER — HYDROXYZINE HCL 25 MG PO TABS
25.0000 mg | ORAL_TABLET | Freq: Four times a day (QID) | ORAL | Status: DC | PRN
  Administered 2016-11-10: 25 mg via ORAL
  Filled 2016-11-10: qty 10
  Filled 2016-11-10: qty 1

## 2016-11-10 MED ORDER — VENLAFAXINE HCL ER 37.5 MG PO CP24
37.5000 mg | ORAL_CAPSULE | Freq: Every day | ORAL | Status: DC
  Administered 2016-11-10 – 2016-11-11 (×2): 37.5 mg via ORAL
  Filled 2016-11-10 (×5): qty 1

## 2016-11-10 NOTE — Progress Notes (Signed)
D- Patient alert and oriented. Patient is irritable this shift and verbalizes that she would like to go home.  Patient states "I was doing good, working.  I don't need to be here.  My mothers just crazy".  Patient continues to deny SI, HI, AVH, and pain. Patient remained in the bed for majority of the shift and interacted minimally with staff. Patient is not vested in treatment and minimizes substance abuse.  Patient is hoping to be able to leave tomorrow after she talks with the psychiatrist.  [facility]. Patient verbalized that she is worried about her job and states "i need my job" A- Scheduled medications administered to patient, per MD orders. Support and encouragement provided.  Routine safety checks conducted every 15 minutes.  Patient informed to notify staff with problems or concerns. R- No adverse drug reactions noted. Patient contracts for safety at this time. Patient compliant with medications and treatment plan.  Patient remains safe at this time.

## 2016-11-10 NOTE — Tx Team (Signed)
Interdisciplinary Treatment and Diagnostic Plan Update  11/10/2016 Time of Session: 0830AM Cortnee Steinmiller MRN: [id number]  Principal Diagnosis: MDD  Secondary Diagnoses: Active Problems:   MDD (major depressive disorder)   Current Medications:  Current Facility-Administered Medications  Medication Dose Route Frequency Provider Last Rate Last Dose  . acetaminophen (TYLENOL) tablet 650 mg  650 mg Oral Q6H PRN Oneta Rack, NP      . alum & mag hydroxide-simeth (MAALOX/MYLANTA) 200-200-20 MG/5ML suspension 30 mL  30 mL Oral Q4H PRN Oneta Rack, NP      . ARIPiprazole (ABILIFY) tablet 2 mg  2 mg Oral Daily Oneta Rack, NP   2 mg at 11/09/16 1548  . loratadine (CLARITIN) tablet 10 mg  10 mg Oral Daily Oneta Rack, NP   10 mg at 11/09/16 1548  . magnesium hydroxide (MILK OF MAGNESIA) suspension 30 mL  30 mL Oral Daily PRN Oneta Rack, NP      . traZODone (DESYREL) tablet 50 mg  50 mg Oral QHS,MR X 1 Oneta Rack, NP   50 mg at 11/09/16 2215   PTA Medications: Prescriptions Prior to Admission  Medication Sig Dispense Refill Last Dose  . ARIPiprazole (ABILIFY) 2 MG tablet Take 1 tablet (2 mg total) by mouth daily. 30 tablet 0 11/08/2016 at Unknown time  . cetirizine (ZYRTEC) 10 MG tablet Take 10 mg by mouth daily as needed for allergies.   Past Week at Unknown time  . traZODone (DESYREL) 50 MG tablet Take 1 tablet (50 mg total) by mouth at bedtime and may repeat dose one time if needed. (Patient taking differently: Take 50 mg by mouth at bedtime. ) 15 tablet 0 11/07/2016 at Unknown time    Patient Stressors: Financial difficulties Marital or family conflict Substance abuse  Patient Strengths: Wellsite geologist fund of knowledge Physical Health  Treatment Modalities: Medication Management, Group therapy, Case management,  1 to 1 session with clinician, Psychoeducation, Recreational therapy.   Physician Treatment Plan for Primary Diagnosis:  MDD  Medication Management: Evaluate patient's response, side effects, and tolerance of medication regimen.  Therapeutic Interventions: 1 to 1 sessions, Unit Group sessions and Medication administration.  Evaluation of Outcomes: Progressing  Physician Treatment Plan for Secondary Diagnosis: Active Problems:   MDD (major depressive disorder)    Medication Management: Evaluate patient's response, side effects, and tolerance of medication regimen.  Therapeutic Interventions: 1 to 1 sessions, Unit Group sessions and Medication administration.  Evaluation of Outcomes: Progressing   RN Treatment Plan for Primary Diagnosis: MDD Long Term Goal(s): Knowledge of disease and therapeutic regimen to maintain health will improve  Short Term Goals: Ability to remain free from injury will improve, Ability to participate in decision making will improve and Ability to verbalize feelings will improve  Medication Management: RN will administer medications as ordered by provider, will assess and evaluate patient's response and provide education to patient for prescribed medication. RN will report any adverse and/or side effects to prescribing provider.  Therapeutic Interventions: 1 on 1 counseling sessions, Psychoeducation, Medication administration, Evaluate responses to treatment, Monitor vital signs and CBGs as ordered, Perform/monitor CIWA, COWS, AIMS and Fall Risk screenings as ordered, Perform wound care treatments as ordered.  Evaluation of Outcomes: Progressing   LCSW Treatment Plan for Primary Diagnosis: MDD Long Term Goal(s): Safe transition to appropriate next level of care at discharge, Engage patient in therapeutic group addressing interpersonal concerns.  Short Term Goals: Engage patient in aftercare planning with referrals and resources,  Facilitate acceptance of mental health diagnosis and concerns and Facilitate patient progression through stages of change regarding substance use  diagnoses and concerns  Therapeutic Interventions: Assess for all discharge needs, 1 to 1 time with Social worker, Explore available resources and support systems, Assess for adequacy in community support network, Educate family and significant other(s) on suicide prevention, Complete Psychosocial Assessment, Interpersonal group therapy.  Evaluation of Outcomes: Progressing   Progress in Treatment: Attending groups: No. New to unit. Continuing to assess.  Participating in groups: No. Taking medication as prescribed: Yes. Toleration medication: Yes. Family/Significant other contact made: No, will contact:  family member if patient consents Patient understands diagnosis: Yes. Discussing patient identified problems/goals with staff: Yes. Medical problems stabilized or resolved: Yes. Denies suicidal/homicidal ideation: Yes. Issues/concerns per patient self-inventory: No. Other: n/a  New problem(s) identified: No, Describe:  n/a  New Short Term/Long Term Goal(s): elimination of SI thoughts; detox, medication management for mood stabilization, development of comprehensive mental wellness/sobriety plan.   Patient Goal: "to get out of here and back to life."   Discharge Plan or Barriers: CSW assessing for appropriate referrals. Pt was last [facility] to [facility] 05/2016. She was sent to [facility] for continued residential treatment. Patient reports that she has no current outpatient providers. [facility]AG pamphlet and AA/NA lists provided to pt for additional community supports.   Reason for Continuation of Hospitalization: Anxiety Depression Medication stabilization Suicidal ideation Withdrawal symptoms  Estimated Length of Stay: Friday 11/12/16  Attendees: Patient: 11/10/2016 8:34 AM  Physician: Dr. Jackquline Berlin MD 11/10/2016 8:34 AM  Nursing: Celene Skeen RN 11/10/2016 8:34 AM  RN Care Manager: Onnie Boer CM 11/10/2016 8:34 AM  Social Worker: Trula Slade, LCSW 11/10/2016 8:34 AM  Recreational Therapist:  x 11/10/2016 8:34 AM  Other: Armandina Stammer NP; Feliz Beam Money NP 11/10/2016 8:34 AM  Other:  11/10/2016 8:34 AM  Other: 11/10/2016 8:34 AM    Scribe for Treatment Team: Ledell Peoples Smart, LCSW 11/10/2016 8:34 AM

## 2016-11-10 NOTE — BHH Group Notes (Signed)
LCSW Group Therapy Note  11/10/2016 1:15pm  Type of Therapy/Topic:  Group Therapy:  Balance in Life  Participation Level:  Active  Description of Group:    This group will address the concept of balance and how it feels and looks when one is unbalanced. Patients will be encouraged to process areas in their lives that are out of balance and identify reasons for remaining unbalanced. Facilitators will guide patients in utilizing problem-solving interventions to address and correct the stressor making their life unbalanced. Understanding and applying boundaries will be explored and addressed for obtaining and maintaining a balanced life. Patients will be encouraged to explore ways to assertively make their unbalanced needs known to significant others in their lives, using other group members and facilitator for support and feedback.  Therapeutic Goals: 1. Patient will identify two or more emotions or situations they have that consume much of in their lives. 2. Patient will identify signs/triggers that life has become out of balance:  3. Patient will identify two ways to set boundaries in order to achieve balance in their lives:  4. Patient will demonstrate ability to communicate their needs through discussion and/or role plays  Summary of Patient Progress: Described balance as "equilibrium, balance of positive and negative, not many things are balanced in my life these days."  Feels like life is a "roller coaster." Chose picture of two people working in [employer] "they are happy, content with what they have, I need to find that for myself."  Described feeling that she needs to meet expectations of others which are overwhelming, wants to feel "I am enough, I don't need all the things other people are telling me that I do."  Identified w peers expressing loneliness and mistrust in developing relationships, offered support/encouragement to peers.    Therapeutic Modalities:   Cognitive Behavioral  Therapy Solution-Focused Therapy Assertiveness Training  Sallee Langenne C Verlia Kaney, [place] 11/10/2016 3:14 PM

## 2016-11-10 NOTE — Plan of Care (Signed)
Problem: Safety: Goal: Periods of time without injury will increase Outcome: Progressing Pt remains a low fall risk, denies SI/HI/AVH at this time.    
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
## 2016-11-10 NOTE — Progress Notes (Signed)
Recreation Therapy Notes  Date:  11/10/16 Time: 0930 Location: [address] Dayroom  Group Topic: Stress Management  Goal Area(s) Addresses:  Patient will verbalize importance of using healthy stress management.  Patient will identify positive emotions associated with healthy stress management.   Intervention: Stress Management  Activity :  Guided Imagery.  LRT introduced the stress management technique of guided imagery.  LRT read a script so patients could follow along and imagine being in their peaceful place.  Patients were to follow along as LRT read the script to engage in the activity.  Education: Stress Management, Discharge Planning.   Education Outcome: Acknowledges edcuation/In group clarification offered/Needs additional education  Clinical Observations/Feedback:  Pt did not attend group.    Lorne Winkels, LRT/CTRS        Damyen Knoll A 11/10/2016 12:33 PM 
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
## 2016-11-10 NOTE — Progress Notes (Signed)
  DATA ACTION RESPONSE  Objective- Pt. is visible in the room, seen reading a book. Presents with an anxious/depressed affect and mood. Pt remains isolative to room provided with encouragement. Rates anxiety a 7/10. Appears vested in treatment. Was hoping to talk to MD about changing IVC status to Vol. Pt is also worried about job status.No abnormal s/s.  Subjective- Denies having any SI/HI/AVH at this time. Rates pain 6/10; headache. Pt. states "I know what I got to do, I just have to do it". Is cooperative and remain safe on the unit.  1:1 interaction in private to establish rapport. Encouragement, education, & support given from staff.  PRN Tylenol and Vistaril requested and will re-eval accordingly.   Safety maintained with Q 15 checks. Continue with POC.

## 2016-11-10 NOTE — [facility] Suicide Risk Assessment (Signed)
[facility] Admission Suicide Risk Assessment   Nursing information obtained from:  Patient Demographic factors:  Caucasian Current Mental Status:  Suicidal ideation indicated by others Loss Factors:  Financial problems / change in socioeconomic status, Decrease in vocational status Historical Factors:  Family history of mental illness or substance abuse Risk Reduction Factors:  Employed  Total Time spent with patient: 30 minutes Principal Problem: Substance Induced Mood Disorder                                   MDD Diagnosis:   Patient Active Problem List   Diagnosis Date Noted  . 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 MDD (major depressive disorder) [F32.9] 11/09/2016  . Substance induced mood disorder (HCC) [F19.94] 06/14/2016  . Severe recurrent major depression without psychotic features (HCC) [F33.2] 06/12/2016   Subjective Data:  31 yo Caucasian female, single, no kids,  lives with her boyfriend. Involuntarily committed by her mom on account of inability to take care of herself. She has been living in a home without electricity. She was attacked by two dogs. Family is concerned that she is at risk to self.  Patient continues to use illicit substances. She was positive for cocaine, amphetamine benzodiazepines and THC.  Patients denies any suicidal thoughts. 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 Says she has been struggling financially. Says she was attacked by two dogs because she was trying to shield her dog from them. Minimizes her substance use. No associated psychosis. No associated thoughts of violence. No associated alteration in her level of consciousness. She is worried about the implications being involuntarily committed could have on her future in the nursing profession. Patient wants to change her status to voluntary instead. Long history of using  substances. Her boyfriend abuses substances too. Patient attempted to hang herself last April. She has attempted to slit her wrist also in the past. She wants to get back on Venlafaxine and Abilify.   Continued Clinical Symptoms:  Alcohol Use Disorder Identification Test Final Score (AUDIT): 0 The "Alcohol Use Disorders Identification Test", Guidelines for Use in Primary Care, Second Edition.  World Science writer [facility]). Score between 0-7:  no or low risk or alcohol related problems. Score between 8-15:  moderate risk of alcohol related problems. Score between 16-19:  high risk of alcohol related problems. Score 20 or above:  warrants further diagnostic evaluation for alcohol dependence and treatment.   CLINICAL FACTORS:  Depression Substance use Loss of status   Musculoskeletal: Strength & Muscle Tone: within normal limits Gait & Station: normal Patient leans: N/A  Psychiatric Specialty Exam: Physical Exam  Constitutional: She is oriented to person, place, and time. She appears well-developed and well-nourished.  HENT:  Head: Normocephalic and atraumatic.  Right Ear: External ear normal.  Eyes: Pupils are equal, round, and reactive to light. Conjunctivae and EOM are normal.  Neck: Normal range of motion. Neck supple.  Cardiovascular: Normal rate, regular rhythm and normal heart sounds.   Respiratory: Effort normal and breath sounds normal.  GI: Soft. Bowel sounds are normal.  Musculoskeletal: Normal range of motion.  Neurological: She is alert and oriented to person, place, and time. She has normal reflexes.  Skin: Skin is warm and dry.  Psychiatric:  As above    ROS   Blood pressure 110/72, pulse  74, temperature 98.1 F (36.7 C), temperature source Oral, resp. rate 16, height 5\' 6"  (1.676 m), weight 55.8 kg (123 lb), last menstrual period 10/12/2016.Body mass index is 19.85 kg/m.  General Appearance: Has dressings over her arm. Casually dressed, calm and  cooperative. Does not appear internally distracted.   Eye Contact:  Good  Speech:  Clear and Coherent  Volume:  Normal  Mood:  Subjectively feels okay.  Affect:  Blunted   Thought Process:  Linear  Orientation:  Full (Time, Place, and Person)  Thought Content:  Rumination  Suicidal Thoughts:  Denies any suicidal thoughts  Homicidal Thoughts:  No  Memory:  WNL  Judgement:  Fair  Insight:  Poor  Psychomotor Activity:  Slightly decreased  Concentration:  Good  Recall:  Good  Fund of Knowledge:  Good  Language:  Good  Akathisia:  No  Handed:    AIMS (if indicated):     Assets:  Communication Skills Desire for Improvement  ADL's:  Intact  Cognition:  WNL  Sleep:  Number of Hours: 6.5      COGNITIVE FEATURES THAT CONTRIBUTE TO RISK:  Polarized thinking    SUICIDE RISK:   Moderate:  Frequent suicidal ideation with limited intensity, and duration, some specificity in terms of plans, no associated intent, good self-control, limited dysphoria/symptomatology, some risk factors present, and identifiable protective factors, including available and accessible social support.  PLAN OF CARE:   1. Effexor 37.5 mg daily  2. Abilify 2 mg daily.  3. Follow BMP 4. Allow voluntary status   I certify that inpatient services furnished can reasonably be expected to improve the patient's condition.   Georgiann Cocker, MD 11/10/2016, 4:17 PM

## 2016-11-10 NOTE — Progress Notes (Signed)
D) Pt. Reports that she is [facility] and [facility].  Pt. Reports anger toward mother who IVC'd patient.  Pt. States she does not understand why mother wanted her here.  Pt. Reports she lives with boyfriend who continues to use substances.  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  Pt. Denies issue with living with boyfriend that continues to use substances.  Pt. Also expressed feeling upset that MD did not restart her buspar.  Pt. Received a dose of effexor, but expressing desire for something for anxiety.  A) Pt. Offered support and encouraged to continue to express needs. R) Pt. Continues to contract for safety and is safe at this time.

## 2016-11-10 NOTE — Plan of Care (Signed)
Problem: Safety: Goal: Periods of time without injury will increase Outcome: Progressing Remains a low fall risk, denies SI/HI/AVH at this time.

## 2016-11-11 LAB — PROLACTIN: PROLACTIN: 19 ng/mL (ref 4.8–23.3)

## 2016-11-11 MED ORDER — BUSPIRONE HCL 5 MG PO TABS: 5.0000 mg | ORAL_TABLET | Freq: Two times a day (BID) | ORAL | Status: DC

## 2016-11-11 MED ORDER — VENLAFAXINE HCL ER 75 MG PO CP24
75.0000 mg | ORAL_CAPSULE | Freq: Every day | ORAL | Status: DC
  Administered 2016-11-12 – 2016-11-13 (×2): 75 mg via ORAL
  Filled 2016-11-11: qty 7
  Filled 2016-11-11 (×2): qty 1
  Filled 2016-11-11: qty 7
  Filled 2016-11-11: qty 1

## 2016-11-11 MED ORDER — BUSPIRONE HCL 5 MG PO TABS
5.0000 mg | ORAL_TABLET | Freq: Three times a day (TID) | ORAL | Status: DC
  Administered 2016-11-11 – 2016-11-13 (×7): 5 mg via ORAL
  Filled 2016-11-11 (×2): qty 21
  Filled 2016-11-11 (×3): qty 1
  Filled 2016-11-11: qty 21
  Filled 2016-11-11 (×7): qty 1
  Filled 2016-11-11 (×3): qty 21

## 2016-11-11 NOTE — Progress Notes (Signed)
Nursing Progress Note: 7p-7a D: Pt currently presents with a depressed/anxious affect and behavior. Pt states "I want to sign the right paperwork for voluntary commitment. I don't want to be IVC any longer than I need to." Interacting appropriately with the milieu. Pt reports good sleep during the previous night with current medication regimen.  A: Pt provided with medications per providers orders. Pt's labs and vitals were monitored throughout the night. Pt supported emotionally and encouraged to express concerns and questions. Pt educated on medications.  R: Pt's safety ensured with 15 minute and environmental checks. Pt currently denies SI, HI, and AVH. Pt verbally contracts to seek staff if SI,HI, or AVH occurs and to consult with staff before acting on any harmful thoughts. Will continue to monitor.

## 2016-11-11 NOTE — B[facility] Group Notes (Signed)
LCSW Group Therapy Note  11/11/2016 1:15pm  Type of Therapy/Topic:  Group Therapy:  Balance in Life  Participation Level:  Did not attend   Description of Group:    This group will address the concept of balance and how it feels and looks when one is unbalanced. Patients will be encouraged to process areas in their lives that are out of balance and identify reasons for remaining unbalanced. Facilitators will guide patients in utilizing problem-solving interventions to address and correct the stressor making their life unbalanced. Understanding and applying boundaries will be explored and addressed for obtaining and maintaining a balanced life. Patients will be encouraged to explore ways to assertively make their unbalanced needs known to significant others in their lives, using other group members and facilitator for support and feedback.  Therapeutic Goals: 1. Patient will identify two or more emotions or situations they have that consume much of in their lives. 2. Patient will identify signs/triggers that life has become out of balance:  3. Patient will identify two ways to set boundaries in order to achieve balance in their lives:  4. Patient will demonstrate ability to communicate their needs through discussion and/or role plays  Summary of Patient Progress:    Therapeutic Modalities:   Cognitive Behavioral Therapy Solution-Focused Therapy Assertiveness Training  Misty AllegraCandace L Roark Rufo, LCSW 11/11/2016 2:33 PM

## 2016-11-11 NOTE — Progress Notes (Signed)
D: Patient denies SI, HI or AVH. Patient has a depressed and slightly irritable mood with a flat affect.  Pt. Prompted to come to medication window for morning medication.  Pt. Was answered question but forwards little otherwise and returned to her room.  Pt. Did not attend morning group about leisure and lifestyle changes.  She reports that her sleep and appetite are good.  She denies any physical complaints or other needs at this time.  A: Patient given emotional support from RN. Patient encouraged to come to staff with concerns and/or questions. Patient's medication routine continued. Patient's orders and plan of care reviewed.   R: Patient remains appropriate and cooperative. Will continue to monitor patient q15 minutes for safety.

## 2016-11-11 NOTE — Progress Notes (Signed)
[facility] MD Progress Note  11/11/2016 11:29 AM Misty Jensen  MRN:  [id number] Subjective:   31 yo Caucasian female, single, no kids, lives with her boyfriend. Involuntarily committed by her mom on account of inability to take care of herself. She has been living in a home without electricity. She was attacked by two dogs. Family is concerned that she is at risk to self.  Patient continues to use illicit substances. She was positive for cocaine, amphetamine benzodiazepines and THC.   Chart reviewed today. Patient discussed at team today.   Staff reports that she is somewhat withdrawn. No behavioral issues. She has not been observed to be internally stimulated. She has not voiced any suicidal thoughts. She has not participated with unit groups or activities.   Seen today. Says she still does not know why her mom committed her into the hospital. Says she had been doing well mentally since her last admission. Says she has been taking her medications as prescribed. Says she has not felt suicidal since the last time she came in here. Feels her mom just does not want her to be with her boyfriend. Says she is worried she might lose her job. Patient states that she has been able to pay her mortgage and car loan. Says they have been using a generator until they can pay outstanding electricity bill. Patient minimizes her substance use. Says she is still being prescribed Adderrall and Clonazepam by her PCP. She denies any form of hallucination. She is does not feel persecuted. She is not expressing any other form of delusion. Patient denies any new legal issues. Still has a pending charge for tampering with her power source. She wants to get back on Buspirone again.  Principal Problem: Substance Induced Mood Disorder.  Diagnosis:   Patient Active Problem List   Diagnosis Date Noted  . 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 MDD (major depressive disorder)  [F32.9] 11/09/2016  . Substance induced mood disorder (HCC) [F19.94] 06/14/2016  . Severe recurrent major depression without psychotic features (HCC) [F33.2] 06/12/2016   Total Time spent with patient: 20 minutes  Past Psychiatric History: As in H&P  Past Medical History:  Past Medical History:  Diagnosis Date  . Anxiety   . Asthma   . Depression   . Drug abuse    THC, cocaine  . Hypothyroidism    patient reports "in past"  . Thyroid disease    History reviewed. No pertinent surgical history. Family History: History reviewed. No pertinent family history. Family Psychiatric  History: As in H&P Social History:  History  Alcohol Use  . Yes    Comment: occasional     History  Drug Use  . Types: Cocaine, Marijuana, Benzodiazepines, "Crack" cocaine, MDMA (Ecstacy)    Comment: xanax    Social History   Social History  . Marital status: Single    Spouse name: N/A  . Number of children: N/A  . Years of education: N/A   Social History Main Topics  . Smoking status: Never Smoker  . Smokeless tobacco: Never Used  . Alcohol use Yes     Comment: occasional  . Drug use: Yes    Types: Cocaine, Marijuana, Benzodiazepines, "Crack" cocaine, MDMA (Ecstacy)     Comment: xanax  . Sexual activity: Yes    Birth control/ protection: Condom   Other Topics Concern  . None   Social History Narrative  . None   Additional Social History:  Sleep: Good  Appetite:  Good  Current Medications: Current Facility-Administered Medications  Medication Dose Route Frequency Provider Last Rate Last Dose  . acetaminophen (TYLENOL) tablet 650 mg  650 mg Oral Q6H PRN Oneta RackLewis, Tanika N, NP   650 mg at 11/10/16 2141  . alum & mag hydroxide-simeth (MAALOX/MYLANTA) 200-200-20 MG/5ML suspension 30 mL  30 mL Oral Q4H PRN Oneta RackLewis, Tanika N, NP      . ARIPiprazole (ABILIFY) tablet 2 mg  2 mg Oral Daily Oneta RackLewis, Tanika N, NP   2 mg at 11/11/16 0825  . hydrOXYzine (ATARAX/VISTARIL) tablet 25 mg  25 mg  Oral Q6H PRN Kerry HoughSimon, Spencer E, PA-C   25 mg at 11/10/16 2143  . loratadine (CLARITIN) tablet 10 mg  10 mg Oral Daily Oneta RackLewis, Tanika N, NP   10 mg at 11/11/16 0825  . magnesium hydroxide (MILK OF MAGNESIA) suspension 30 mL  30 mL Oral Daily PRN Oneta RackLewis, Tanika N, NP      . traZODone (DESYREL) tablet 50 mg  50 mg Oral QHS,MR X 1 Oneta RackLewis, Tanika N, NP   50 mg at 11/10/16 2142  . venlafaxine XR (EFFEXOR-XR) 24 hr capsule 37.5 mg  37.5 mg Oral Daily Braniyah Besse, Delight OvensVincent A, MD   37.5 mg at 11/11/16 0825    Lab Results:  Results for orders placed or performed during the hospital encounter of 11/09/16 (from the past 48 hour(s))  Hemoglobin A1c     Status: None   Collection Time: 11/10/16  6:33 AM  Result Value Ref Range   Hgb A1c MFr Bld 4.9 4.8 - 5.6 %    Comment: (NOTE) Pre diabetes:          5.7%-6.4% Diabetes:              >6.4% Glycemic control for   <7.0% adults with diabetes    Mean Plasma Glucose 93.93 mg/dL    Comment: Performed at [facility], [address] [place], [place] [address]  Lipid panel     Status: Abnormal   Collection Time: 11/10/16  6:33 AM  Result Value Ref Range   Cholesterol 149 0 - 200 mg/dL   Triglycerides 098153 (H) <150 mg/dL   HDL 42 >11>40 mg/dL   Total CHOL/HDL Ratio 3.5 RATIO   VLDL 31 0 - 40 mg/dL   LDL Cholesterol 76 0 - 99 mg/dL    Comment:        Total Cholesterol/HDL:CHD Risk Coronary Heart Disease Risk Table                     Men   Women  1/2 Average Risk   3.4   3.3  Average Risk       5.0   4.4  2 X Average Risk   9.6   7.1  3 X Average Risk  23.4   11.0        Use the calculated Patient Ratio above and the CHD Risk Table to determine the patient's CHD Risk.        ATP III CLASSIFICATION (LDL):  <100     mg/dL   Optimal  [id number]  mg/dL   Near or Above                    Optimal  130-159  mg/dL   Borderline  [id number]  mg/dL   High  >086>190     mg/dL   Very High Performed at [facility], [address] [place], [place] [address]    Prolactin     Status: None   Collection Time: 11/10/16  6:33 AM  Result Value Ref Range   Prolactin 19.0 4.8 - 23.3 ng/mL    Comment: (NOTE) Performed At: [facility] [address] [place], [place] [id number] Mila Homer MD OZ:[id number] Performed at [facility], [address], [place], [place] [address]   TSH     Status: None   Collection Time: 11/10/16  6:33 AM  Result Value Ref Range   TSH 2.190 0.350 - 4.500 uIU/mL    Comment: Performed by a 3rd Generation assay with a functional sensitivity of <=0.01 uIU/mL. Performed at [facility], [address], [place], [place] [address]     Blood Alcohol level:  Lab Results  Component Value Date   [facility] <5 11/08/2016   ETH <5 06/12/2016    Metabolic Disorder Labs: Lab Results  Component Value Date   HGBA1C 4.9 11/10/2016   MPG 93.93 11/10/2016   Lab Results  Component Value Date   PROLACTIN 19.0 11/10/2016   Lab Results  Component Value Date   CHOL 149 11/10/2016   TRIG 153 (H) 11/10/2016   HDL 42 11/10/2016   CHOLHDL 3.5 11/10/2016   VLDL 31 11/10/2016   LDLCALC 76 11/10/2016    Physical Findings: AIMS: Facial and Oral Movements Muscles of Facial Expression: None, normal Lips and Perioral Area: None, normal Jaw: None, normal Tongue: None, normal,Extremity Movements Upper (arms, wrists, hands, fingers): None, normal Lower (legs, knees, ankles, toes): None, normal, Trunk Movements Neck, shoulders, hips: None, normal, Overall Severity Severity of abnormal movements (highest score from questions above): None, normal Incapacitation due to abnormal movements: None, normal Patient's awareness of abnormal movements (rate only patient's report): No Awareness, Dental Status Current problems with teeth and/or dentures?: No Does patient usually wear dentures?: No  CIWA:    COWS:     Musculoskeletal: Strength & Muscle Tone: within normal limits Gait & Station:  normal Patient leans: N/A  Psychiatric Specialty Exam: Physical Exam  Constitutional: She is oriented to person, place, and time. No distress.  HENT:  Head: Normocephalic and atraumatic.  Respiratory: Effort normal.  Neurological: She is alert and oriented to person, place, and time.  Skin: Skin is warm and dry. She is not diaphoretic.  Psychiatric:  As above.     ROS  Blood pressure 103/82, pulse 91, temperature 98.3 F (36.8 C), temperature source Oral, resp. rate 18, height 5\' 6"  (1.676 m), weight 55.8 kg (123 lb), last menstrual period 10/12/2016.Body mass index is 19.85 kg/m.  General Appearance: Casually dressed, limited grooming. Good relatedness. Appropriate behavior. Not internally distracted.   Eye Contact:  Good  Speech:  Clear and Coherent and Normal Rate  Volume:  Normal  Mood:  Overwhelmed by being here  Affect:  Appropriate and Restricted  Thought Process:  Linear  Orientation:  Full (Time, Place, and Person)  Thought Content:  No delusional theme. No preoccupation with violent thoughts. No negative ruminations. No obsession.  No hallucination in any modality.   Suicidal Thoughts:  No  Homicidal Thoughts:  No  Memory:  Immediate;   Good Recent;   Good Remote;   Good  Judgement:  Fair  Insight:  Shallow  Psychomotor Activity:  Normal  Concentration:  WNL  Recall:  Good  Fund of Knowledge:  Good  Language:  Good  Akathisia:  Negative  Handed:    AIMS (if indicated):  Assets:  Communication Skills Desire for Improvement Housing Intimacy Physical Health Resilience  ADL's:  Fair  Cognition:  WNL  Sleep:  Number of Hours: 6.75     Assessment and Plan  Patient is not pervasively depressed. She is not psychotic or manic. Patient denies suicidal or homicidal thoughts. She is not expressing any thoughts of violence. She has poor insight into her addiction. She is not motivated to stay sober. She is not invested in inpatient addiction treatment. We plan  to gather collateral and evaluate her further.   Psychiatric: SUD MDD recurrent  Medical:  Psychosocial: Suspended professional license  Dysfunctional relationship.   PLAN: 1. Increase Venlafaxine to 75 mg daily 2. Add Buspirone 5 mg TID 3. Encourage unit groups and activities 4. SW would gather collateral from her mom and boyfriend   Georgiann Cocker, MD 11/11/2016, 11:29 AM

## 2016-11-12 NOTE — Progress Notes (Signed)
Nursing Note 11/12/2016 [id number]  Data Reports sleeping good with PRN sleep med.  Rates depression 4/10, hopelessness 4/10, and anxiety 5/10. Affect wide ranged and appropriate.  Denies HI, SI, AVH.  Attending groups, polite and appropriate with peers.  Continues to discuss planning to live with current boyfriend stating (regarding boyfriend's drug use) "we talked about it point blank, this needs to stop and we both need to get back to work to pay for our house."  Action Spoke with patient 1:1, nurse offered support to patient throughout shift.  Continues to be monitored on 15 minute checks for safety.  Response Remains safe and appropriate on unit.  Disappointed that she wasn't discharging today.

## 2016-11-12 NOTE — Progress Notes (Signed)
Recreation Therapy Notes  Date: 11/12/16 Time: 0930 Location: [address] Dayroom  Group Topic: Stress Management  Goal Area(s) Addresses:  Patient will verbalize importance of using healthy stress management.  Patient will identify positive emotions associated with healthy stress management.   Intervention: Stress Management  Activity :  Progressive Muscle Relaxation.  LRT introduced the stress management technique of progressive muscle relaxation.  Patients were to follow along as LRT read script to fully engage in the technique.  Education:  Stress Management, Discharge Planning.   Education Outcome: Acknowledges edcuation/In group clarification offered/Needs additional education  Clinical Observations/Feedback: Pt did not attend group.    Lido Maske, LRT/CTRS         Gillie Crisci A 11/12/2016 12:17 PM 

## 2016-11-12 NOTE — [facility] Group Notes (Signed)
LCSW Group Therapy Note  11/12/2016 1:15pm  Type of Therapy and Topic:  Group Therapy:  Feelings around Relapse and Recovery  Participation Level:  Minimal   Description of Group:    Patients in this group will discuss emotions they experience before and after a relapse. They will process how experiencing these feelings, or avoidance of experiencing them, relates to having a relapse. Facilitator will guide patients to explore emotions they have related to recovery. Patients will be encouraged to process which emotions are more powerful. They will be guided to discuss the emotional reaction significant others in their lives may have to their relapse or recovery. Patients will be assisted in exploring ways to respond to the emotions of others without this contributing to a relapse.  Therapeutic Goals: 1. Patient will identify two or more emotions that lead to a relapse for them 2. Patient will identify two emotions that result when they relapse 3. Patient will identify two emotions related to recovery 4. Patient will demonstrate ability to communicate their needs through discussion and/or role plays   Summary of Patient Progress:  Patient attended group in it's entirety. She did not actively participate in group discussion but was attentive while others shared.   Therapeutic Modalities:   Cognitive Behavioral Therapy Solution-Focused Therapy Assertiveness Training Relapse Prevention Therapy   Cashe N Smart, LCSW 11/12/2016 3:01 PM  

## 2016-11-12 NOTE — Progress Notes (Signed)
[facility] MD Progress Note  11/12/2016 10:30 AM Misty Jensen  MRN:  [id number] Subjective:   31 yo Caucasian female, single, no kids, lives with her boyfriend. Involuntarily committed by her mom on account of inability to take care of herself. She has been living in a home without electricity. She was attacked by two dogs. Family is concerned that she is at risk to self.  Patient continues to use illicit substances. She was positive for cocaine, amphetamine benzodiazepines and THC.   Chart reviewed today. Patient discussed at team today.   Staff reports that she is interacting more with peers. She has been focused on getting off IVC as she is concerned it might have a negative impact on her future. No behavioral issues. She has not been observed to be internally stimulated. She has not voiced nay futility thoughts. She has been tolerating her medications well.   SW spoke to her boyfriend and her mom today. Mom is very concerned about her. States that her boyfriend is a Higher education careers adviser. States that he has been pimping her out. Mom is concerned that she would wind up dead if she continues with her current lifestyle. Mom states that her boyfriend has the dogs that attacked her. No concerns about suicidality.    Seen today. Patient understands her mom's concerns. Says she has been with her boyfriend for over two years. Says they are working things out. Says they have both agreed that they both need to get off drugs and work in a productive manner. Patient states that her goal is to get in [facility]. She participated in that program for about five weeks after ADACT treatment months ago. Finds the program helpful. 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 Patient notes that she is feeling better now she is back on her medicine. Effects of substances wearing off is also playing a role here. Patient is focused on getting back to work. No evidence of psychosis. No evidence of mania. Depression is lifting.    Principal Problem: Substance Induced Mood Disorder.  Diagnosis:   Patient Active Problem List   Diagnosis Date Noted  . 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 MDD (major depressive disorder) [F32.9] 11/09/2016  . Substance induced mood disorder (HCC) [F19.94] 06/14/2016  . Severe recurrent major depression without psychotic features (HCC) [F33.2] 06/12/2016   Total Time spent with patient: 20 minutes  Past Psychiatric History: As in H&P  Past Medical History:  Past Medical History:  Diagnosis Date  . Anxiety   . Asthma   . Depression   . Drug abuse    THC, cocaine  . Hypothyroidism    patient reports "in past"  . Thyroid disease    History reviewed. No pertinent surgical history. Family History: History reviewed. No pertinent family history. Family Psychiatric  History: As in H&P Social History:  History  Alcohol Use  . Yes    Comment: occasional     History  Drug Use  . Types: Cocaine, Marijuana, Benzodiazepines, "Crack" cocaine, MDMA (Ecstacy)    Comment: xanax    Social History   Social History  . Marital status: Single    Spouse name: N/A  . Number of children: N/A  . Years of education: N/A   Social History Main Topics  . Smoking status: Never Smoker  . Smokeless tobacco: Never Used  . Alcohol use Yes     Comment: occasional  . Drug use: Yes  Types: Cocaine, Marijuana, Benzodiazepines, "Crack" cocaine, MDMA (Ecstacy)     Comment: xanax  . Sexual activity: Yes    Birth control/ protection: Condom   Other Topics Concern  . None   Social History Narrative  . None   Additional Social History:      Sleep: Good  Appetite:  Good  Current Medications: Current Facility-Administered Medications  Medication Dose Route Frequency Provider Last Rate Last Dose  . acetaminophen (TYLENOL) tablet 650 mg  650 mg Oral Q6H PRN Oneta RackLewis, Tanika N, NP   650 mg at 11/11/16 2118  . alum & mag  hydroxide-simeth (MAALOX/MYLANTA) 200-200-20 MG/5ML suspension 30 mL  30 mL Oral Q4H PRN Oneta RackLewis, Tanika N, NP      . ARIPiprazole (ABILIFY) tablet 2 mg  2 mg Oral Daily Oneta RackLewis, Tanika N, NP   2 mg at 11/12/16 0944  . busPIRone (BUSPAR) tablet 5 mg  5 mg Oral TID Georgiann CockerIzediuno, Vincent A, MD   5 mg at 11/12/16 0944  . hydrOXYzine (ATARAX/VISTARIL) tablet 25 mg  25 mg Oral Q6H PRN Kerry HoughSimon, Spencer E, PA-C   25 mg at 11/10/16 2143  . loratadine (CLARITIN) tablet 10 mg  10 mg Oral Daily Oneta RackLewis, Tanika N, NP   10 mg at 11/12/16 0944  . magnesium hydroxide (MILK OF MAGNESIA) suspension 30 mL  30 mL Oral Daily PRN Oneta RackLewis, Tanika N, NP      . traZODone (DESYREL) tablet 50 mg  50 mg Oral QHS,MR X 1 Oneta RackLewis, Tanika N, NP   50 mg at 11/11/16 2118  . venlafaxine XR (EFFEXOR-XR) 24 hr capsule 75 mg  75 mg Oral Daily Izediuno, Delight OvensVincent A, MD   75 mg at 11/12/16 [id number]    Lab Results:  No results found for this or any previous visit (from the past 48 hour(s)).  Blood Alcohol level:  Lab Results  Component Value Date   ETH <5 11/08/2016   ETH <5 06/12/2016    Metabolic Disorder Labs: Lab Results  Component Value Date   HGBA1C 4.9 11/10/2016   MPG 93.93 11/10/2016   Lab Results  Component Value Date   PROLACTIN 19.0 11/10/2016   Lab Results  Component Value Date   CHOL 149 11/10/2016   TRIG 153 (H) 11/10/2016   HDL 42 11/10/2016   CHOLHDL 3.5 11/10/2016   VLDL 31 11/10/2016   LDLCALC 76 11/10/2016    Physical Findings: AIMS: Facial and Oral Movements Muscles of Facial Expression: None, normal Lips and Perioral Area: None, normal Jaw: None, normal Tongue: None, normal,Extremity Movements Upper (arms, wrists, hands, fingers): None, normal Lower (legs, knees, ankles, toes): None, normal, Trunk Movements Neck, shoulders, hips: None, normal, Overall Severity Severity of abnormal movements (highest score from questions above): None, normal Incapacitation due to abnormal movements: None, normal Patient's  awareness of abnormal movements (rate only patient's report): No Awareness, Dental Status Current problems with teeth and/or dentures?: No Does patient usually wear dentures?: No  CIWA:    COWS:     Musculoskeletal: Strength & Muscle Tone: within normal limits Gait & Station: normal Patient leans: N/A  Psychiatric Specialty Exam: Physical Exam  Constitutional: She is oriented to person, place, and time. No distress.  HENT:  Head: Normocephalic and atraumatic.  Respiratory: Effort normal.  Neurological: She is alert and oriented to person, place, and time.  Skin: Skin is warm and dry. She is not diaphoretic.  Psychiatric:  As above.     ROS  Blood pressure 103/82, pulse 91, temperature 98.3  F (36.8 C), temperature source Oral, resp. rate 18, height 5\' 6"  (1.676 m), weight 55.8 kg (123 lb).Body mass index is 19.85 kg/m.  General Appearance: Neatly dressed. Psychomotor activity is back to normal. Good relatedness. Appropriate behavior. Not internally distracted.   Eye Contact:  Good  Speech:  Spontaneous, normal prosody. Normal tone and rate.   Volume:  Normal  Mood: Euthymic  Affect:  Appropriate and Restricted  Thought Process:  Linear  Orientation:  Full (Time, Place, and Person)  Thought Content:  Future oriented. No delusional theme. No preoccupation with violent thoughts. No negative ruminations. No obsession.  No hallucination in any modality.   Suicidal Thoughts:  No  Homicidal Thoughts:  No  Memory:  Immediate;   Good Recent;   Good Remote;   Good  Judgement:  Fair  Insight:  Seems to have good insight though motivation to carry her plans through is questionable.   Psychomotor Activity:  Normal  Concentration:  WNL  Recall:  Good  Fund of Knowledge:  Good  Language:  Good  Akathisia:  Negative  Handed:    AIMS (if indicated):     Assets:  Communication Skills Desire for Improvement Housing Intimacy Physical Health Resilience  ADL's:  Fair  Cognition:   WNL  Sleep:  Number of Hours: 6.75     Assessment and Plan  Patient's mood has improved remarkably. Effects of coming off substances and resumption of her medications likely played a role. She is not pervasively depressed. No dangerousness. We would evaluate her further. Hopeful discharge tomorrow.  Psychiatric: SUD MDD recurrent  Medical:  Psychosocial: Suspended professional license  Dysfunctional relationship.   PLAN: 1. Continue current regimen 2. Continue to monitor mood, behavior and interaction with peers   Georgiann Cocker, MD 11/12/2016, 10:30 AMPatient ID: Aleene Davidson, female   DOB: Oct 20, 1985, 31 y.o.   MRN: [id number]

## 2016-11-12 NOTE — [facility] Suicide Risk Assessment (Signed)
[facility] INPATIENT:  Family/Significant Other Suicide Prevention Education  Suicide Prevention Education:  Education Completed; Misty QualeDaryl Sloan (pt's boyfriend) [phone] has been identified by the patient as the family member/significant other with whom the patient will be residing, and identified as the person(s) who will aid the patient in the event of a mental health crisis (suicidal ideations/suicide attempt).  With written consent from the patient, the family member/significant other has been provided the following suicide prevention education, prior to the and/or following the discharge of the patient.  The suicide prevention education provided includes the following:  Suicide risk factors  Suicide prevention and interventions  National Suicide Hotline telephone number  [facility] assessment telephone number  [facility] Emergency Assistance 911  [facility] and/or Residential Mobile Crisis Unit telephone number  Request made of family/significant other to:  Remove weapons (e.g., guns, rifles, knives), all items previously/currently identified as safety concern.    Remove drugs/medications (over-the-counter, prescriptions, illicit drugs), all items previously/currently identified as a safety concern.  The family member/significant other verbalizes understanding of the suicide prevention education information provided.  The family member/significant other agrees to remove the items of safety concern listed above.  Pt's boyfriend has no questions or concerns. "I'm ready for her to come home. She sound really good." No safety concerns noted by pt's boyfriend. SPE and aftercare reviewed.   Jaleya N Smart LCSW 11/12/2016, 10:01 AM

## 2016-11-12 NOTE — Progress Notes (Signed)
  [facility] :  Will you be returning to the same living situation after discharge:  Yes,  home At discharge, do you have transportation home?: Yes,  friend will pick her up on Saturday at discharge. (11/13/16). Do you have the ability to pay for your medications: Yes,  mental health  Release of information consent forms completed and submitted to medical records by CSW.  Patient to Follow up at: Follow-up Information    [facility] Follow up on 11/17/2016.   Why:  Walk in on Wednesday 11/17/16 to get assessed for outpatient mental health services including: medication management, counseling, and SAIOP. Walk in hours: Monday, Wednesday, Friday 12:30PM-3:00PM. Thank you.  Contact information: [address] [place], [place] [address] Phone: [phone] Fax: [phone]          Next level of care provider [facility]:no  Safety Planning and Suicide Prevention discussed: Yes,  SPE completed with both pt's mother and pt's boyfriend. SPI pamphlet and Mobile Crisis information provided to pt.   Have you used any form of tobacco in the last 30 days? (Cigarettes, Smokeless Tobacco, Cigars, and/or Pipes): No  Has patient been referred to the Quitline?: N/A patient is not a smoker  Patient has been referred for addiction treatment: Yes  Pulte HomesHeather N Smart, LCSW 11/12/2016, 12:47 PM
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
## 2016-11-12 NOTE — Progress Notes (Signed)
Patient ID: Misty DavidsonHeather Jensen, female   DOB: Dec 08, 1985, 31 y.o.   MRN: [id number]  D: Patient observed watching TV and interacting well with peers on approach. Pt reports she had a good day and visit from her mother. Pt reports she did not need to be here but her mother was over reacting. 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 Pt attended evening AA group and engaged in discussions. Denies SI/HI/AVH and pain.No behavioral issues noted.  A: Support and encouragement offered as needed. Medications administered as prescribed.  R: Patient is safe and cooperative on unit. Will continue to monitor  for safety and stability.

## 2016-11-13 MED ORDER — CETIRIZINE HCL 10 MG PO TABS: 10.0000 mg | ORAL_TABLET | Freq: Every day | ORAL | Status: DC | PRN

## 2016-11-13 MED ORDER — TRAZODONE HCL 100 MG PO TABS: 100.0000 mg | ORAL_TABLET | Freq: Every day | ORAL | Status: DC

## 2016-11-13 MED ORDER — BUSPIRONE HCL 5 MG PO TABS: 5.0000 mg | ORAL_TABLET | Freq: Three times a day (TID) | ORAL | 0 refills | Status: DC

## 2016-11-13 MED ORDER — VENLAFAXINE HCL ER 75 MG PO CP24: 75.0000 mg | ORAL_CAPSULE | Freq: Every day | ORAL | 0 refills | Status: DC

## 2016-11-13 MED ORDER — ARIPIPRAZOLE 2 MG PO TABS: 2.0000 mg | ORAL_TABLET | Freq: Every day | ORAL | 0 refills | Status: DC

## 2016-11-13 MED ORDER — HYDROXYZINE HCL 25 MG PO TABS: 25.0000 mg | ORAL_TABLET | Freq: Four times a day (QID) | ORAL | 0 refills | Status: DC | PRN

## 2016-11-13 MED ORDER — TRAZODONE HCL 100 MG PO TABS: ORAL_TABLET | ORAL | 0 refills | Status: DC

## 2016-11-13 NOTE — [facility] Suicide Risk Assessment (Signed)
[facility]   Principal Problem: Substance induced mood disorder [facility](HCC) Discharge Diagnoses:  Patient Active Problem List   Diagnosis Date Noted  . 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 MDD (major depressive disorder) [F32.9] 11/09/2016  . Substance induced mood disorder (HCC) [F19.94] 06/14/2016  . Severe recurrent major depression without psychotic features (HCC) [F33.2] 06/12/2016    Total Time spent with patient: 45 minutes  Musculoskeletal: Strength & Muscle Tone: within normal limits Gait & Station: normal Patient leans: N/A  Psychiatric Specialty Exam: Review of Systems  Constitutional: Negative.   HENT: Negative.   Eyes: Negative.   Respiratory: Negative.   Cardiovascular: Negative.   Gastrointestinal: Negative.   Genitourinary: Negative.   Musculoskeletal: Negative.   Skin: Negative.   Neurological: Negative.   Endo/Heme/Allergies: Negative.   Psychiatric/Behavioral: Negative for depression, hallucinations and suicidal ideas. The patient is not nervous/anxious and does not have insomnia.     Blood pressure 122/79, pulse 83, temperature 97.7 F (36.5 C), temperature source Oral, resp. rate 16, height 5\' 6"  (1.676 m), weight 55.8 kg (123 lb).Body mass index is 19.85 kg/m.  General Appearance: Neatly dressed, pleasant, engaging well and cooperative. Appropriate behavior. Not in any distress. Good relatedness. Not internally stimulated  Eye Contact::  Good  Speech: Spontaneous, normal prosody. Normal tone and rate.   Volume:  Normal  Mood:  Euthymic  Affect:  Appropriate and Full Range  Thought Process:  Linear  Orientation:  Full (Time, Place, and Person)  Thought Content:  Future oriented. No delusional theme. No preoccupation with violent thoughts. No negative ruminations. No obsession.  No hallucination in any modality.   Suicidal Thoughts:  No  Homicidal Thoughts:  No   Memory:  Immediate;   Good Recent;   Good Remote;   Good  Judgement:  Good  Insight:  Fair  Psychomotor Activity:  Normal  Concentration:  Good  Recall:  Good  Fund of Knowledge:Good  Language: Good  Akathisia:  Negative  Handed:    AIMS (if indicated):     Assets:  Communication Skills Desire for Improvement Housing Intimacy Physical Health Resilience Talents/Skills Transportation Vocational/Educational  Sleep:  Number of Hours: 6.75  Cognition: WNL  ADL's:  Intact   Clinical Assessment::   31 yo Caucasian female, single, no kids,  lives with her boyfriend. Involuntarily committed by her mom on account of inability to take care of herself. She has been living in a home without electricity. She was attacked by two dogs. Family is concerned that she is at risk to self.  Patient continues to use illicit substances. She was positive for cocaine, amphetamine benzodiazepines and THC.   Seen today. Pleased to be getting discharged. Her mom visited her yesterday. Says it went well. Mom is aware she is being discharged today.  Reports being in good spirits. Not feeling depressed. Reports normal energy and interest. Wants to get back to work this weekend. She has been sleeping well. Feels well rested. She has been eating normally.  She is able to think clearly. She is able to focus on task. Her thoughts are not crowded or racing. No evidence of mania. No hallucination in any modality. She is not making any delusional statement. No passivity of will/thought. She is fully in touch with reality. No thoughts of suicide. No thoughts of homicide. No violent thoughts. No overwhelming anxiety. No access to weapons. No craving for substances.   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 Patient has been interacting well with peers. No behavioral issues. Patient has not voiced any suicidal thoughts. Patient has not been observed to be internally stimulated. Patient has been  adherent with treatment recommendations. Patient has been tolerating their medication well.   Patient was discussed at team. Team members feels that patient is back to her baseline level of function. Team agrees with plan to discharge patient today.   Demographic Factors:  Caucasian  Loss Factors: Decrease in vocational status and Financial problems/change in socioeconomic status  Historical Factors: Prior suicide attempts and Impulsivity  Risk Reduction Factors:   Sense of responsibility to family, Employed, Living with another person, especially a relative, Positive social support, Positive therapeutic relationship and Positive coping skills or problem solving skills  Continued Clinical Symptoms:  As above   Cognitive Features That Contribute To Risk:  None    Suicide Risk:  Minimal: No identifiable suicidal ideation.  Patient is not having any thoughts of suicide at this time. Modifiable risk factors targeted during this admission includes depression and substance use. Demographical and historical risk factors cannot be modified. Patient is now engaging well. Patient is reliable and is future oriented. We have buffered patient's support structures. At this point, patient is at low risk of suicide. Patient is aware of the effects of psychoactive substances on decision making process. Patient has been provided with emergency contacts. Patient acknowledges to use resources provided if unforseen circumstances changes their current risk stratification.    Follow-up Information    [facility] Follow up on 11/17/2016.   Why:  Walk in on Wednesday 11/17/16 to get assessed for outpatient mental health services including: medication management, counseling, and SAIOP. Walk in hours: Monday, Wednesday, Friday 12:30PM-3:00PM. Thank you.  Contact information: [address] [place], [place] [address] Phone: [phone] Fax: [phone]          Plan Of Care/Follow-up  recommendations:  1. Continue current psychotropic medications 2. Mental health and addiction follow up as arranged.  3. Discharge in care of her family 4. Provided limited quantity of prescriptions   Georgiann Cocker, MD 11/13/2016, 9:28 AM

## 2016-11-13 NOTE — B[facility] Group Notes (Signed)
LCSW Group Therapy Note  11/13/2016     10:00-11:00AM  Type of Therapy and Topic:  Group Therapy:  Decisional Balance/Substance Use  Participation Level:  Did Not Attend        . Description of Group:  The main focus of today's process group was learning how to use a decisional balance exercise to make a decision about whether to change an unhealthy coping skill, as well as how to use the information gathered in the actual process of planning that change.  Patients listed some of their most frequently utilized unhealthy coping techniques and CSW pointed out the similarities.  Motivational Interviewing and the whiteboard were utilized to help patients explore in-depth the perceived benefits and costs of a specific, shared unhealthy coping technique (drinking & drugging) as well as the benefits and costs of replacing that with other, healthy coping skills.  A handout was distributed for patients to be able to do this exercise for themselves.     Therapeutic Goals 1. Patient will be able to utilize the decision balance exercise on their own 2. Patient will list coping skills they use to fulfill their needs 3. Patient will identify the differences between healthy and  unhealthy coping skills 4. Patient will verbalize the costs and benefits of drinking/drugging versus making the choice to change 5. Patient will learn how to use the exercise to identify the most important supports to put in place so that they can succeed in a change to which they commit  Summary of Patient Progress: N/A   Therapeutic Modalities Cognitive Behavioral Therapy Motivational Interviewing   Lynnell ChadMareida J Grossman-Orr, LCSW 11/13/2016 12:56 PM

## 2016-11-13 NOTE — Progress Notes (Signed)
Adult Psychoeducational Group Note  Date:  11/13/2016 Time:  1300  Group Topic/Focus:  Identifying Needs:   The focus of this group is to help patients identify their personal needs that have been historically problematic and identify healthy behaviors to address their needs.  Participation Level:  Active  Participation Quality:  Appropriate  Affect:  Appropriate  Cognitive:  Appropriate  Insight: Appropriate  Engagement in Group:  Engaged  Modes of Intervention:  Discussion and Education  Additional Comments:     

## 2016-11-13 NOTE — Progress Notes (Signed)
Discharge note: Pt received both written and verbal discharge instructions. Pt verbalized understanding of discharge instructions. Pt agreed to f/u appt and med regimen. Pt received sample meds, prescriptions, SRA, AVS and transitional record. Pt gathered belongings from room and locker. Pt [facility] and asked how can she go about filing a claim. Writer [place] Per Lillia [place], pt may contact patient experience to report lost belongings. Pt provided with contact information. Pt safely discharged to the lobby.

## 2016-11-13 NOTE — Discharge Summary (Signed)
Physician Discharge Summary Note  Patient:  Misty DavidsonHeather Jensen is an 31 y.o., female  MRN:  [id number]  DOB:  [birth year]  Patient phone:  [phone] (home)   Patient address[place]   [address] [place] [id number],   Total Time spent with patient: Greater than 30 minutes  Date of Admission:  11/09/2016  Date of Discharge: 11/13/2016  Reason for Admission:   Principal Problem: Substance induced mood disorder [facility](HCC)  Discharge Diagnoses: Patient Active Problem List   Diagnosis Date Noted  . 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 Substance induced mood disorder (HCC) [F19.94] 06/14/2016    Priority: Medium  . MDD (major depressive disorder) [F32.9] 11/09/2016  . Severe recurrent major depression without psychotic features (HCC) [F33.2] 06/12/2016   Past Medical History:  Past Medical History:  Diagnosis Date  . Anxiety   . Asthma   . Depression   . Drug abuse    THC, cocaine  . Hypothyroidism    patient reports "in past"  . Thyroid disease    History reviewed. No pertinent surgical history.  Family History: History reviewed. No pertinent family history.  Family Psychiatric  History: See H&P  Social History:  History  Alcohol Use  . Yes    Comment: occasional     History  Drug Use  . Types: Cocaine, Marijuana, Benzodiazepines, "Crack" cocaine, MDMA (Ecstacy)    Comment: xanax    Social History   Social History  . Marital status: Single    Spouse name: N/A  . Number of children: N/A  . Years of education: N/A   Social History Main Topics  . Smoking status: Never Smoker  . Smokeless tobacco: Never Used  . Alcohol use Yes     Comment: occasional  . Drug use: Yes    Types: Cocaine, Marijuana, Benzodiazepines, "Crack" cocaine, MDMA (Ecstacy)     Comment: xanax  . Sexual activity: Yes    Birth control/ protection: Condom   Other Topics Concern  . None   Social History  Narrative  . None   Hospital Course: Misty Jensen is a 31 yo Caucasian female, single, no kids, lives with her boyfriend. Involuntarily committed by her mom on account of inability to take care of herself. She has been living in a home without electricity. She was attacked by two dogs. Family is concerned that she is at risk to self. Patient continues to use illicit substances. She was positive for cocaine, amphetamine benzodiazepines and THC.   After admission evaluation, Misty Jensen was started on medication regimen for her mental illness (Bipolar disorder). She did not receive any detoxification treatments as she was not presenting with any substance withdrawal symptoms. She received & was discharged on; Abilify 2 mg for mood control, Buspar 5 mg for anxiety, Hydroxyzine 25 mg prn for anxiety, Effexor XR 75 mg for depression & Trazodone 100 mg for insomnia. She was enrolled & participated in the group counseling sessions being offered & held on this unit. She learned coping skills. Mayme presented no other significant medical issues that required treatment or monitoring. She tolerated her treatment regimen without any adverse effects or reactions reported.  Misty Jensen is seen today by the attending psychiatrist. Pleased to be getting discharged. Her mom visited her yesterday. Says it went well. Mom is aware she is being discharged today. Reports being in good spirits. Not feeling depressed. Reports normal energy and interest. Wants to get back to work this weekend. She  has been sleeping well. Feels well rested. She has been eating normally.  She is able to think clearly. She is able to focus on task. Her thoughts are not crowded or racing. No evidence of mania. No hallucination in any modality. She is not making any delusional statement. No passivity of will/thought. She is fully in touch with reality. No thoughts of suicide. No thoughts of homicide. No violent thoughts. No overwhelming anxiety. No access to  weapons. No craving for substances.   The ursing staff reports that patient has been appropriate on the unit. Patient has been interacting well with peers. No behavioral issues. Patient has not voiced any suicidal thoughts. Patient has not been observed to be internally stimulated. Patient has been adherent with treatment recommendations. Patient has been tolerating their medication well.   Patient was discussed at the team meeting this morning. The team members feels that patient is back to her baseline level of function. Team agrees with plan to discharge patient today to continue mental health care & substance abuse treatment at the [facility] as noted below. She is provided with all the necessary information needed to make this appointment without problems. She was provided with a 7 days worth, supply samples of his [facility] discharge medications. She left [facility] with all personal belongings in no apparent distress. Transportation per her arrangement.  Physical Findings: AIMS: Facial and Oral Movements Muscles of Facial Expression: None, normal Lips and Perioral Area: None, normal Jaw: None, normal Tongue: None, normal,Extremity Movements Upper (arms, wrists, hands, fingers): None, normal Lower (legs, knees, ankles, toes): None, normal, Trunk Movements Neck, shoulders, hips: None, normal, Overall Severity Severity of abnormal movements (highest score from questions above): None, normal Incapacitation due to abnormal movements: None, normal Patient's awareness of abnormal movements (rate only patient's report): No Awareness, Dental Status Current problems with teeth and/or dentures?: No Does patient usually wear dentures?: No  CIWA:    COWS:     Musculoskeletal: Strength & Muscle Tone: within normal limits Gait & Station: normal Patient leans: N/A  Psychiatric Specialty Exam: See MD SRA Physical Exam  Constitutional: She appears well-developed.  HENT:  Head: Normocephalic.  Eyes: Pupils are  equal, round, and reactive to light.  Neck: Normal range of motion.  Cardiovascular: Normal rate.   Respiratory: Effort normal.  Genitourinary:  Genitourinary Comments: Deferred  Musculoskeletal: Normal range of motion.  Neurological: She is alert.  Skin: Skin is warm.    Review of Systems  Constitutional: Negative.   HENT: Negative.   Eyes: Negative.   Respiratory: Negative.   Cardiovascular: Negative.   Gastrointestinal: Negative.   Genitourinary: Negative.   Musculoskeletal: Negative.   Skin: Negative.   Neurological: Negative.   Endo/Heme/Allergies: Negative.   Psychiatric/Behavioral: Positive for depression (Stable) and substance abuse (Hx. Amphetamine, benzodiazepine, Cocain & THC dependence). Negative for memory loss and suicidal ideas. The patient has insomnia (Stable). The patient is not nervous/anxious.     Blood pressure 122/79, pulse 83, temperature 97.7 F (36.5 C), temperature source Oral, resp. rate 16, height 5\' 6"  (1.676 m), weight 55.8 kg (123 lb).Body mass index is 19.85 kg/m.  See Md's SRA   Have you used any form of tobacco in the last 30 days? (Cigarettes, Smokeless Tobacco, Cigars, and/or Pipes): No  Has this patient used any form of tobacco in the last 30 days? (Cigarettes, Smokeless Tobacco, Cigars, and/or Pipes) Yes, Yes, A prescription for an FDA-approved tobacco cessation medication was offered at discharge and the patient refused  Blood Alcohol  level:  Lab Results  Component Value Date   ETH <5 11/08/2016   ETH <5 06/12/2016   Metabolic Disorder Labs:  Lab Results  Component Value Date   HGBA1C 4.9 11/10/2016   MPG 93.93 11/10/2016   Lab Results  Component Value Date   PROLACTIN 19.0 11/10/2016   Lab Results  Component Value Date   CHOL 149 11/10/2016   TRIG 153 (H) 11/10/2016   HDL 42 11/10/2016   CHOLHDL 3.5 11/10/2016   VLDL 31 11/10/2016   LDLCALC 76 11/10/2016   See Psychiatric Specialty Exam and Suicide Risk Assessment  completed by Attending Physician prior to discharge.  Discharge destination:  Home  Is patient on multiple antipsychotic therapies at discharge:  No   Has Patient had three or more failed trials of antipsychotic monotherapy by history:  No  Recommended Plan for Multiple Antipsychotic Therapies: NA  Allergies as of 11/13/2016      Reactions   Codeine Anaphylaxis   Sulfa Antibiotics Anaphylaxis      Medication List    TAKE these medications     Indication  ARIPiprazole 2 MG tablet Commonly known as:  ABILIFY Take 1 tablet (2 mg total) by mouth daily. For mood control What changed:  additional instructions  Indication:  Mood control   busPIRone 5 MG tablet Commonly known as:  BUSPAR Take 1 tablet (5 mg total) by mouth 3 (three) times daily. For anxiety  Indication:  Anxiety Disorder   cetirizine 10 MG tablet Commonly known as:  ZYRTEC Take 1 tablet (10 mg total) by mouth daily as needed for allergies.  Indication:  Perennial Allergic Rhinitis, Hayfever   hydrOXYzine 25 MG tablet Commonly known as:  ATARAX/VISTARIL Take 1 tablet (25 mg total) by mouth every 6 (six) hours as needed for anxiety.  Indication:  Feeling Anxious   traZODone 100 MG tablet Commonly known as:  DESYREL Take 100 mg at bedtime: For sleep What changed:  medication strength  how much to take  how to take this  when to take this  additional instructions  Indication:  Trouble Sleeping   venlafaxine XR 75 MG 24 hr capsule Commonly known as:  EFFEXOR-XR Take 1 capsule (75 mg total) by mouth daily. For depression  Indication:  Major Depressive Disorder      Follow-up Information    [facility] Follow up on 11/17/2016.   Why:  Walk in on Wednesday 11/17/16 to get assessed for outpatient mental health services including: medication management, counseling, and SAIOP. Walk in hours: Monday, Wednesday, Friday 12:30PM-3:00PM. Thank you.  Contact information: [address],  [place] [address] Phone: [phone] Fax: [phone]         Follow-up recommendations: Activity:  As tolerated Diet: As recommended by your primary care doctor. Keep all scheduled follow-up appointments as recommended.   Comments: Patient is instructed prior to discharge to: Take all medications as prescribed by his/her mental healthcare provider. Report any adverse effects and or reactions from the medicines to his/her outpatient provider promptly. Patient has been instructed & cautioned: To not engage in alcohol and or illegal drug use while on prescription medicines. In the event of worsening symptoms, patient is instructed to call the crisis hotline, 911 and or go to the nearest ED for appropriate evaluation and treatment of symptoms. To follow-up with his/her primary care provider for your other medical issues, concerns and or health care needs.   Signed: Sanjuana Kava, NP, PMHNP, FNP-BC. 11/13/2016, 9:42 AM

## 2017-10-21 LAB — OB RESULTS CONSOLE HEPATITIS B SURFACE ANTIGEN: Hepatitis B Surface Ag: NEGATIVE

## 2017-10-21 LAB — OB RESULTS CONSOLE RUBELLA ANTIBODY, IGM: Rubella: IMMUNE

## 2017-11-04 ENCOUNTER — Other Ambulatory Visit: Payer: Self-pay | Admitting: Obstetrics and Gynecology

## 2017-11-04 ENCOUNTER — Other Ambulatory Visit (HOSPITAL_COMMUNITY)
Admission: RE | Admit: 2017-11-04 | Discharge: 2017-11-04 | Disposition: A | Discharge status: Home / Self Care | Payer: Medicaid Other | Source: Ambulatory Visit | Attending: Obstetrics and Gynecology | Admitting: Obstetrics and Gynecology

## 2017-11-04 DIAGNOSIS — Z01411 Encounter for gynecological examination (general) (routine) with abnormal findings: Secondary | ICD-10-CM | POA: Diagnosis not present

## 2017-11-04 LAB — OB RESULTS CONSOLE GC/CHLAMYDIA
Chlamydia: NEGATIVE
GC PROBE AMP, GENITAL: NEGATIVE

## 2017-11-04 LAB — OB RESULTS CONSOLE HEPATITIS B SURFACE ANTIGEN: Hepatitis B Surface Ag: NEGATIVE

## 2017-11-04 LAB — OB RESULTS CONSOLE RUBELLA ANTIBODY, IGM: RUBELLA: IMMUNE

## 2017-11-08 LAB — CYTOLOGY - PAP
Diagnosis: NEGATIVE
HPV: NOT DETECTED

## 2018-02-06 LAB — HIV ANTIBODY (ROUTINE TESTING W REFLEX): HIV SCREEN 4TH GENERATION: NONREACTIVE

## 2018-02-06 LAB — OB RESULTS CONSOLE RPR: RPR: NONREACTIVE

## 2018-02-06 LAB — OB RESULTS CONSOLE HIV ANTIBODY (ROUTINE TESTING): HIV: NONREACTIVE

## 2018-03-15 NOTE — L&D Delivery Note (Addendum)
Delivery Note At 11:15 PM a viable female (Misty Jensen) was delivered via Vaginal, Spontaneous (Presentation:direct OP).  APGAR: 9, 9; weight  pending   Placenta status: complete.  Cord: 3V with the following complications: none.  Cord pH: n/a  Anesthesia:  Epidural/local for repair Episiotomy: None Lacerations: Labial (superficial, not repaired); 2nd degree Vaginal,  2nd degree Perineal Suture Repair: 2.0 vicryl Est. Blood Loss (mL): 379  Mom to postpartum.  Baby to Couplet care / Skin to Skin.  Jonetta Speak 04/12/2018, 12:29 AM

## 2018-03-24 ENCOUNTER — Institutional Professional Consult (permissible substitution): Payer: Self-pay | Admitting: Pediatrics

## 2018-03-31 ENCOUNTER — Ambulatory Visit (INDEPENDENT_AMBULATORY_CARE_PROVIDER_SITE_OTHER): Payer: Self-pay | Admitting: Pediatrics

## 2018-03-31 DIAGNOSIS — Z7681 Expectant parent(s) prebirth pediatrician visit: Secondary | ICD-10-CM

## 2018-04-04 NOTE — Progress Notes (Signed)
Prenatal counseling for impending newborn done-- 1st child, currently 25, no complications, h/o depression, late prenatal care 12wks Z76.81

## 2018-04-11 ENCOUNTER — Inpatient Hospital Stay (HOSPITAL_COMMUNITY): Payer: Medicaid Other | Admitting: Anesthesiology

## 2018-04-11 ENCOUNTER — Inpatient Hospital Stay (HOSPITAL_COMMUNITY)
Admission: AD | Admit: 2018-04-11 | Discharge: 2018-04-13 | DRG: 806 | Disposition: A | Discharge status: Home / Self Care | Payer: Medicaid Other | Attending: Obstetrics and Gynecology | Admitting: Obstetrics and Gynecology

## 2018-04-11 ENCOUNTER — Encounter (HOSPITAL_COMMUNITY): Payer: Self-pay

## 2018-04-11 ENCOUNTER — Other Ambulatory Visit: Payer: Self-pay

## 2018-04-11 DIAGNOSIS — D649 Anemia, unspecified: Secondary | ICD-10-CM | POA: Diagnosis present

## 2018-04-11 DIAGNOSIS — A6000 Herpesviral infection of urogenital system, unspecified: Secondary | ICD-10-CM | POA: Diagnosis present

## 2018-04-11 DIAGNOSIS — Z3A37 37 weeks gestation of pregnancy: Secondary | ICD-10-CM | POA: Diagnosis not present

## 2018-04-11 DIAGNOSIS — O4292 Full-term premature rupture of membranes, unspecified as to length of time between rupture and onset of labor: Secondary | ICD-10-CM | POA: Diagnosis present

## 2018-04-11 DIAGNOSIS — O4290 Premature rupture of membranes, unspecified as to length of time between rupture and onset of labor, unspecified weeks of gestation: Secondary | ICD-10-CM

## 2018-04-11 DIAGNOSIS — O9832 Other infections with a predominantly sexual mode of transmission complicating childbirth: Secondary | ICD-10-CM | POA: Diagnosis present

## 2018-04-11 DIAGNOSIS — O9902 Anemia complicating childbirth: Secondary | ICD-10-CM | POA: Diagnosis present

## 2018-04-11 DIAGNOSIS — Z3483 Encounter for supervision of other normal pregnancy, third trimester: Secondary | ICD-10-CM | POA: Diagnosis present

## 2018-04-11 LAB — CBC
HCT: 35.3 % — ABNORMAL LOW (ref 36.0–46.0)
Hemoglobin: 11.5 g/dL — ABNORMAL LOW (ref 12.0–15.0)
MCH: 29.2 pg (ref 26.0–34.0)
MCHC: 32.6 g/dL (ref 30.0–36.0)
MCV: 89.6 fL (ref 80.0–100.0)
Platelets: 230 10*3/uL (ref 150–400)
RBC: 3.94 MIL/uL (ref 3.87–5.11)
RDW: 15.3 % (ref 11.5–15.5)
WBC: 12.3 10*3/uL — ABNORMAL HIGH (ref 4.0–10.5)
nRBC: 0 % (ref 0.0–0.2)

## 2018-04-11 LAB — OB RESULTS CONSOLE GBS: GBS: NEGATIVE

## 2018-04-11 LAB — TYPE AND SCREEN
ABO/RH(D): O POS
Antibody Screen: NEGATIVE

## 2018-04-11 LAB — RPR: RPR Ser Ql: NONREACTIVE

## 2018-04-11 LAB — RAPID URINE DRUG SCREEN, HOSP PERFORMED
Amphetamines: NOT DETECTED
Barbiturates: NOT DETECTED
Benzodiazepines: NOT DETECTED
Cocaine: NOT DETECTED
Opiates: NOT DETECTED
Tetrahydrocannabinol: NOT DETECTED

## 2018-04-11 LAB — POCT FERN TEST: POCT Fern Test: POSITIVE — AB

## 2018-04-11 LAB — GROUP B STREP BY PCR: GROUP B STREP BY PCR: NEGATIVE

## 2018-04-11 LAB — ABO/RH: ABO/RH(D): O POS

## 2018-04-11 MED ORDER — ONDANSETRON HCL 4 MG/2ML IJ SOLN: 4.0000 mg | Freq: Four times a day (QID) | INTRAMUSCULAR | Status: DC | PRN

## 2018-04-11 MED ORDER — LACTATED RINGERS IV SOLN: 500.0000 mL | Freq: Once | INTRAVENOUS | Status: DC

## 2018-04-11 MED ORDER — LACTATED RINGERS IV SOLN
500.0000 mL | Freq: Once | INTRAVENOUS | Status: AC
  Administered 2018-04-11: 500 mL via INTRAVENOUS

## 2018-04-11 MED ORDER — TERBUTALINE SULFATE 1 MG/ML IJ SOLN
0.2500 mg | Freq: Once | INTRAMUSCULAR | Status: DC | PRN
  Filled 2018-04-11: qty 1

## 2018-04-11 MED ORDER — EPHEDRINE 5 MG/ML INJ: 10.0000 mg | INTRAVENOUS | Status: DC | PRN

## 2018-04-11 MED ORDER — PHENYLEPHRINE 40 MCG/ML (10ML) SYRINGE FOR IV PUSH (FOR BLOOD PRESSURE SUPPORT): 80.0000 ug | PREFILLED_SYRINGE | INTRAVENOUS | Status: DC | PRN

## 2018-04-11 MED ORDER — EPHEDRINE 5 MG/ML INJ
10.0000 mg | INTRAVENOUS | Status: DC | PRN
  Filled 2018-04-11: qty 2

## 2018-04-11 MED ORDER — PHENYLEPHRINE 40 MCG/ML (10ML) SYRINGE FOR IV PUSH (FOR BLOOD PRESSURE SUPPORT)
80.0000 ug | PREFILLED_SYRINGE | INTRAVENOUS | Status: DC | PRN
  Filled 2018-04-11: qty 10

## 2018-04-11 MED ORDER — DIPHENHYDRAMINE HCL 50 MG/ML IJ SOLN: 12.5000 mg | INTRAMUSCULAR | Status: DC | PRN

## 2018-04-11 MED ORDER — OXYTOCIN 40 UNITS IN NORMAL SALINE INFUSION - SIMPLE MED
1.0000 m[IU]/min | INTRAVENOUS | Status: DC
  Administered 2018-04-11: 2 m[IU]/min via INTRAVENOUS
  Filled 2018-04-11: qty 1000

## 2018-04-11 MED ORDER — LIDOCAINE HCL (PF) 1 % IJ SOLN
30.0000 mL | INTRAMUSCULAR | Status: AC | PRN
  Administered 2018-04-11: 30 mL via SUBCUTANEOUS
  Filled 2018-04-11: qty 30

## 2018-04-11 MED ORDER — MISOPROSTOL 200 MCG PO TABS
ORAL_TABLET | ORAL | Status: AC
  Filled 2018-04-11: qty 5

## 2018-04-11 MED ORDER — OXYTOCIN BOLUS FROM INFUSION
500.0000 mL | Freq: Once | INTRAVENOUS | Status: AC
  Administered 2018-04-11: 500 mL via INTRAVENOUS

## 2018-04-11 MED ORDER — FENTANYL 2.5 MCG/ML BUPIVACAINE 1/10 % EPIDURAL INFUSION (WH - ANES)
14.0000 mL/h | INTRAMUSCULAR | Status: DC | PRN
  Administered 2018-04-11 (×2): 14 mL/h via EPIDURAL
  Filled 2018-04-11 (×2): qty 100

## 2018-04-11 MED ORDER — VALACYCLOVIR HCL 500 MG PO TABS
500.0000 mg | ORAL_TABLET | Freq: Every day | ORAL | Status: DC
  Administered 2018-04-11: 500 mg via ORAL
  Filled 2018-04-11: qty 1

## 2018-04-11 MED ORDER — LACTATED RINGERS IV SOLN
INTRAVENOUS | Status: DC
  Administered 2018-04-11 (×3): via INTRAVENOUS

## 2018-04-11 MED ORDER — LACTATED RINGERS IV SOLN: 500.0000 mL | INTRAVENOUS | Status: DC | PRN

## 2018-04-11 MED ORDER — ACETAMINOPHEN 325 MG PO TABS: 650.0000 mg | ORAL_TABLET | ORAL | Status: DC | PRN

## 2018-04-11 MED ORDER — FLEET ENEMA 7-19 GM/118ML RE ENEM: 1.0000 | ENEMA | RECTAL | Status: DC | PRN

## 2018-04-11 MED ORDER — SOD CITRATE-CITRIC ACID 500-334 MG/5ML PO SOLN: 30.0000 mL | ORAL | Status: DC | PRN

## 2018-04-11 MED ORDER — LIDOCAINE HCL (PF) 1 % IJ SOLN
INTRAMUSCULAR | Status: DC | PRN
  Administered 2018-04-11: 5 mL via EPIDURAL

## 2018-04-11 MED ORDER — PHENYLEPHRINE 40 MCG/ML (10ML) SYRINGE FOR IV PUSH (FOR BLOOD PRESSURE SUPPORT)
80.0000 ug | PREFILLED_SYRINGE | INTRAVENOUS | Status: DC | PRN
  Filled 2018-04-11 (×2): qty 10

## 2018-04-11 MED ORDER — OXYTOCIN 40 UNITS IN NORMAL SALINE INFUSION - SIMPLE MED
2.5000 [IU]/h | INTRAVENOUS | Status: DC
  Filled 2018-04-11: qty 1000

## 2018-04-11 NOTE — H&P (Addendum)
Misty Jensen is a 33 y.o. female, G1P0 at 37+3 weeks, presenting for SROM at home at 0145, clear fluid with a small bit of pink, no odor. She is seen by Dr. Dion Body at [facility]. She has had a low risk pregnancy except for an early placenta previa which is now resolved. She was treated for BV and cervicitis in November. She also had an outbreak of genital herpes in November and has been on Valtrex ever since. Speculum exam in MAU is negative for lesions. PMH is significant for  PCOS, migraines, colonoscopy/polyps, MDD, polysubstance abuse (cocaine, amphetamines, benzodiazepines, THC), inpatient detoxification and mood stabilization. Pt is pleasant with appropriate affect, states her current mood is stable, denies SI.  Patient Active Problem List   Diagnosis Date Noted  . Normal labor 04/11/2018  . 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 MDD (major depressive disorder) 11/09/2016  . Substance induced mood disorder (HCC) 06/14/2016  . Severe recurrent major depression without psychotic features (HCC) 06/12/2016    History of present pregnancy: Patient entered care at 14 weeks.   EDC of 04/29/18 was established by LMP c/w 14wk [place].   Placenta previa on Anatomy US, resolved on subsequent [place]. [place] at [redacted]w[redacted]d (02/15/19) with MFM showed EFW 49%ile, AFV WNL, posterior placenta, "smallish" AC (16%ile).  OB History    Gravida  1   Para      Term      Preterm      AB      Living        SAB      TAB      Ectopic      Multiple      Live Births             Past Medical History:  Diagnosis Date  . Anxiety   . Asthma   . Depression   . Drug abuse (HCC)    THC, cocaine  . Hypothyroidism    patient reports "in past"  . Thyroid disease    Past Surgical History:  Procedure Laterality Date  . NO PAST SURGERIES     Family History: family history is not on file. Social History:  reports that she has never smoked. She has never  used smokeless tobacco. She reports previous alcohol use. She reports current drug use. Drugs: Cocaine, Marijuana, Benzodiazepines, "Crack" cocaine, and MDMA (Ecstacy).   Prenatal Transfer Tool  Maternal Diabetes: No Genetic Screening: Normal Maternal Ultrasounds/Referrals: Abnormal:  Findings:   Other: placenta previa, now resolved Fetal Ultrasounds or other Referrals:  None Maternal Substance Abuse:  No not during this pregnancy; Clean since 2018 per pt. UDS sent. Significant Maternal Medications:  None Significant Maternal Lab Results: None  TDAP-UTD Flu- yes  ROS:  All ten systems reviewed and negative, except as noted. Denies VB.  Denies headache, epigatrsic pain, and visual sx. Reports good FM.   Allergies  Allergen Reactions  . Codeine Anaphylaxis  . Sulfa Antibiotics Anaphylaxis     Dilation: 1 Effacement (%): 30 Station: -3 Exam by:: Latricia Heft, RN Blood pressure 116/87, pulse (!) 112, temperature 98.4 F (36.9 C), resp. rate 19, height 5\' 6"  (1.676 m), weight 81.6 kg.  Chest clear Heart RRR without murmur Abd gravid, NT Pelvic: Normal vulva and vagina Ext: No signs or symptoms of DVT  FHR: Baseline 145, NICHD category 1, moderate variability, accels present, decels absent. UCs: 4 in 10 minutes, 60-100 seconds long  Prenatal labs: ABO, Rh:  O+  Antibody:  neg Rubella:   Imm RPR:   NR HBsAg:   neg HIV:   NR GBS:  PCR PENDING GC:  neg Chlamydia:  neg Genetic screenings:  normal Glucola:  96 Hgb 11.4 at NOB, 12 at 28 weeks  Assessment: 34 y.o., G1P0 at 37+3 weeks AMA SROM at 0145 with onset of spontaneous contractions   Plan: Admit to [facility] Suite per consult with Dr. Mora Appl Routine [facility] orders Pain med/epidural prn PCN G for GBS prophylaxis if GBS PCR comes back positive  Jonetta Speak, CNM 04/11/2018, 4:02 AM

## 2018-04-11 NOTE — Progress Notes (Signed)
Labor Progress Note Misty Jensen is a 33 y.o. G1P0 at [redacted]w[redacted]d    Subjective:  Misty Jensen is comfortable with her epidural, unaware of contractions, not feeling any pressure.  Objective:  BP 123/82   Pulse 100   Temp 98.2 F (36.8 C) (Axillary)   Resp 18   Ht 5\' 6"  (1.676 m)   Wt 81.6 kg   SpO2 99%   BMI 29.05 kg/m    FHT: Category 1 UC:   4-5 in 10 minuntes SVE:   10/100/+1 by Robin Petrakis, CNM Pitocin at 16 mu/min MVUs 140- waveform appears dampened, clear fluid in IUPC.  Assessment/Plan:  Fetal Wellbeing: Cat 1, reactive Labor: Progressing. ROM x 18.5h, afebrile Preeclampsia:  normotensive GBS:   neg Pain Control:  epidural Anticipated MOD:  SVD  Made plan with patient and RN to start pushing after 30 minutes laboring down at 2040. Pt pushes effectively with practice push.  Jonetta Speak, CNM 04/11/2018, 8:12 PM

## 2018-04-11 NOTE — Anesthesia Pain Management Evaluation Note (Signed)
  CRNA Pain Management Visit Note  Patient: Misty Jensen, 33 y.o., female  "Hello I am a member of the anesthesia team at [facility]. We have an anesthesia team available at all times to provide care throughout the hospital, including epidural management and anesthesia for C-section. I don't know your plan for the delivery whether it a natural birth, water birth, IV sedation, nitrous supplementation, doula or epidural, but we want to meet your pain goals."   1.Was your pain managed to your expectations on prior hospitalizations?   No prior hospitalizations  2.What is your expectation for pain management during this hospitalization?     Epidural, IV pain meds and Nitrous Oxide  3.How can we help you reach that goal? Be available, requested epidural  Record the patient's initial score and the patient's pain goal.   Pain: 7  Pain Goal: 5 [facility] wants you to be able to say your pain was always managed very well.  [facility] 04/11/2018

## 2018-04-11 NOTE — Anesthesia Procedure Notes (Signed)
Epidural Patient location during procedure: OB Start time: 04/11/2018 9:01 AM End time: 04/11/2018 9:16 AM  Staffing Anesthesiologist: Trevor Iha, MD Performed: anesthesiologist   Preanesthetic Checklist Completed: patient identified, site marked, surgical consent, pre-op evaluation, timeout performed, IV checked, risks and benefits discussed and monitors and equipment checked  Epidural Patient position: sitting Prep: site prepped and draped and DuraPrep Patient monitoring: continuous pulse ox and blood pressure Approach: midline Location: L3-L4 Injection technique: LOR air  Needle:  Needle type: Tuohy  Needle gauge: 17 G Needle length: 9 cm and 9 Needle insertion depth: 5 cm cm Catheter type: closed end flexible Catheter size: 19 Gauge Catheter at skin depth: 10 cm Test dose: negative  Assessment Events: blood not aspirated, injection not painful, no injection resistance, negative IV test and no paresthesia  Additional Notes Patient identified. Risks/Benefits/Options discussed with patient including but not limited to bleeding, infection, nerve damage, paralysis, failed block, incomplete pain control, headache, blood pressure changes, nausea, vomiting, reactions to medication both or allergic, itching and postpartum back pain. Confirmed with bedside nurse the patient's most recent platelet count. Confirmed with patient that they are not currently taking any anticoagulation, have any bleeding history or any family history of bleeding disorders. Patient expressed understanding and wished to proceed. All questions were answered. Sterile technique was used throughout the entire procedure. Please see nursing notes for vital signs. Test dose was given through epidural needle and negative prior to continuing to dose epidural or start infusion. Warning signs of high block given to the patient including shortness of breath, tingling/numbness in hands, complete motor block, or any  concerning symptoms with instructions to call for help. Patient was given instructions on fall risk and not to get out of bed. All questions and concerns addressed with instructions to call with any issues. 1 Attempt (S) . Patient tolerated procedure well.

## 2018-04-11 NOTE — Anesthesia Preprocedure Evaluation (Signed)
Anesthesia Evaluation  Patient identified by MRN, date of birth, ID band Patient awake    Reviewed: Allergy & Precautions, NPO status , Patient's Chart, lab work & pertinent test results  Airway Mallampati: II  TM Distance: >3 FB Neck ROM: Full    Dental no notable dental hx. (+) Teeth Intact, Dental Advisory Given   Pulmonary neg pulmonary ROS, asthma ,    Pulmonary exam normal breath sounds clear to auscultation       Cardiovascular negative cardio ROS Normal cardiovascular exam Rhythm:Regular Rate:Normal     Neuro/Psych PSYCHIATRIC DISORDERS Anxiety Depression    GI/Hepatic (+)     substance abuse  ,   Endo/Other  Hypothyroidism   Renal/GU      Musculoskeletal   Abdominal   Peds  Hematology  (+) anemia , Hgb 11.5  Plts 230   Anesthesia Other Findings   Reproductive/Obstetrics (+) Pregnancy                             Anesthesia Physical Anesthesia Plan  ASA: II  Anesthesia Plan: Epidural   Post-op Pain Management:    Induction:   PONV Risk Score and Plan: Treatment may vary due to age or medical condition  Airway Management Planned:   Additional Equipment:   Intra-op Plan:   Post-operative Plan:   Informed Consent: I have reviewed the patients History and Physical, chart, labs and discussed the procedure including the risks, benefits and alternatives for the proposed anesthesia with the patient or authorized representative who has indicated his/her understanding and acceptance.       Plan Discussed with:   Anesthesia Plan Comments:         Anesthesia Quick Evaluation

## 2018-04-11 NOTE — Progress Notes (Signed)
Labor Progress Note Misty Jensen is a 34 y.o. female, G1P0 at 37+3 weeks, presenting for SROM at home at 0145.  Subjective:  Misty Jensen is resting comfortably in bed, rates her irregular contractions mild.   Objective:  BP 105/79   Pulse (!) 108   Temp 98.4 F (36.9 C) (Oral)   Resp 18   Ht 5\' 6"  (1.676 m)   Wt 81.6 kg   BMI 29.05 kg/m    FHT: baseline 130, NICHD category 1, moderate variability, accels present, decels absent. UC:   irregular SVE:   Dilation: 3.5 Effacement (%): 30 Station: -3 Exam by:: Okey Regal, CNM  Pitocin: will start now MVUs n/a  Assessment/Plan:  Fetal Wellbeing: Cat 1 Labor: Progressing Preeclampsia:  n/a, normotensive GBS:  negative  Pain Control:  undecided Anticipated MOD:  SVD  Jonetta Speak, CNM 04/11/2018, 5:58 AM

## 2018-04-11 NOTE — MAU Note (Signed)
Pt states water broke at 0145-clear/pinkish fluid. Reports some cramping. Reports +FM but less than normal. Cervix was closed last week

## 2018-04-11 NOTE — Progress Notes (Signed)
Misty Jensen is a 33 y.o. G1P0 at [redacted]w[redacted]d   Subjective: Pt starting to feel contractions.  Still leaking clear to pink fluid.  Considering epidural.  Objective: BP 122/80   Pulse 96   Temp 97.7 F (36.5 C) (Oral)   Resp 16   Ht 5\' 6"  (1.676 m)   Wt 81.6 kg   BMI 29.05 kg/m  No intake/output data recorded. No intake/output data recorded.  FHT:  FHR: 130s bpm, variability: moderate,  accelerations:  Present,  decelerations:  Absent UC:   regular, every 3 minutes SVE:   Dilation: 3.5 Effacement (%): 30 Station: -3 Exam by:: Okey Regal, CNM   Labs: Lab Results  Component Value Date   WBC 12.3 (H) 04/11/2018   HGB 11.5 (L) 04/11/2018   HCT 35.3 (L) 04/11/2018   MCV 89.6 04/11/2018   PLT 230 04/11/2018    Assessment / Plan: IUP @ 37 3/7 weeks  Labor: Progressing normally on Pitocin. Preeclampsia:  BP normal Fetal Wellbeing:  Category I Pain Control:  Discussed  Pt will likely get epidural. I/D:  PROM x 7 hours. Anticipated MOD:  NSVD  Geryl Rankins 04/11/2018, 8:40 AM

## 2018-04-11 NOTE — Progress Notes (Signed)
Caye Yott is a 33 y.o. G1P0 at [redacted]w[redacted]d   Subjective: Pt comfortable.  Felt pressure earlier.  Objective: BP 122/81   Pulse (!) 101   Temp 99.3 F (37.4 C) (Oral)   Resp 20   Ht 5\' 6"  (1.676 m)   Wt 81.6 kg   SpO2 99%   BMI 29.05 kg/m  No intake/output data recorded. No intake/output data recorded.  FHT:  140s,, reactive, moderate variability, no declerations UC:   regular, every 3 minutes SVE:   Dilation: Lip/rim Effacement (%): 100 Station: Plus 1 Exam by:: Nyaisha Simao +fetal scalp stimulation Active fetus palpated on cervical exam.  Cervix reduced and pt pushed.  Anterior lip returned.    Labs: Lab Results  Component Value Date   WBC 12.3 (H) 04/11/2018   HGB 11.5 (L) 04/11/2018   HCT 35.3 (L) 04/11/2018   MCV 89.6 04/11/2018   PLT 230 04/11/2018    Assessment / Plan: IUP @ 37 3/7 weeks  Labor: Progressed on Pitocin but protracted.  Increase Pitocin until MVUs 200+. Preeclampsia:  BP normal Fetal Wellbeing:  Category I Pain Control: Epidural I/D:  PROM X 18 hours, afebrile. Anticipated MOD:  NSVD  Geryl Rankins 04/11/2018, 6:49 PM

## 2018-04-11 NOTE — Progress Notes (Signed)
Pt comfortable s/p epidural.  Denies feeling pressure.  BP 96/67   Pulse (!) 102   Temp 98.7 F (37.1 C) (Oral)   Resp 16   Ht 5\' 6"  (1.676 m)   Wt 81.6 kg   SpO2 99%   BMI 29.05 kg/m   Gen:  NAD, pt lying on left side on peanut. Dilation: 8.5 Effacement (%): 100 Cervical Position: Middle Station: 0 Presentation: Vertex Exam by:: Ardis Hughs  Deferred exam.  RN just checked.  Cat I tracing.  Increase baseline to 150s.  Current temp 98.9  A/P IUP @ 37 3/7 weeks Progressing well on Pitocin. PROM x 12 hours.  Afebrile but increased baseline. Start Unasyn if fever. Epidural in place. Anticipate SVD.

## 2018-04-12 LAB — CBC
HCT: 28.7 % — ABNORMAL LOW (ref 36.0–46.0)
Hemoglobin: 9.5 g/dL — ABNORMAL LOW (ref 12.0–15.0)
MCH: 29.5 pg (ref 26.0–34.0)
MCHC: 33.1 g/dL (ref 30.0–36.0)
MCV: 89.1 fL (ref 80.0–100.0)
PLATELETS: 192 10*3/uL (ref 150–400)
RBC: 3.22 MIL/uL — ABNORMAL LOW (ref 3.87–5.11)
RDW: 15.4 % (ref 11.5–15.5)
WBC: 18.1 10*3/uL — ABNORMAL HIGH (ref 4.0–10.5)
nRBC: 0 % (ref 0.0–0.2)

## 2018-04-12 MED ORDER — COCONUT OIL OIL
1.0000 "application " | TOPICAL_OIL | Status: DC | PRN
  Administered 2018-04-12: 1 via TOPICAL
  Filled 2018-04-12: qty 120

## 2018-04-12 MED ORDER — DIPHENHYDRAMINE HCL 25 MG PO CAPS: 25.0000 mg | ORAL_CAPSULE | Freq: Four times a day (QID) | ORAL | Status: DC | PRN

## 2018-04-12 MED ORDER — SENNOSIDES-DOCUSATE SODIUM 8.6-50 MG PO TABS
2.0000 | ORAL_TABLET | ORAL | Status: DC
  Administered 2018-04-13: 2 via ORAL
  Filled 2018-04-12: qty 2

## 2018-04-12 MED ORDER — BENZOCAINE-MENTHOL 20-0.5 % EX AERO
1.0000 "application " | INHALATION_SPRAY | CUTANEOUS | Status: DC | PRN
  Administered 2018-04-12: 1 via TOPICAL
  Filled 2018-04-12: qty 56

## 2018-04-12 MED ORDER — IBUPROFEN 600 MG PO TABS
600.0000 mg | ORAL_TABLET | Freq: Four times a day (QID) | ORAL | Status: DC
  Administered 2018-04-12 – 2018-04-13 (×8): 600 mg via ORAL
  Filled 2018-04-12 (×8): qty 1

## 2018-04-12 MED ORDER — DIBUCAINE 1 % RE OINT: 1.0000 "application " | TOPICAL_OINTMENT | RECTAL | Status: DC | PRN

## 2018-04-12 MED ORDER — ONDANSETRON HCL 4 MG PO TABS: 4.0000 mg | ORAL_TABLET | ORAL | Status: DC | PRN

## 2018-04-12 MED ORDER — ACETAMINOPHEN 325 MG PO TABS
650.0000 mg | ORAL_TABLET | Freq: Four times a day (QID) | ORAL | Status: DC
  Administered 2018-04-12 – 2018-04-13 (×8): 650 mg via ORAL
  Filled 2018-04-12 (×8): qty 2

## 2018-04-12 MED ORDER — TETANUS-DIPHTH-ACELL PERTUSSIS 5-2.5-18.5 LF-MCG/0.5 IM SUSP: 0.5000 mL | Freq: Once | INTRAMUSCULAR | Status: DC

## 2018-04-12 MED ORDER — SIMETHICONE 80 MG PO CHEW: 80.0000 mg | CHEWABLE_TABLET | ORAL | Status: DC | PRN

## 2018-04-12 MED ORDER — PRENATAL MULTIVITAMIN CH
1.0000 | ORAL_TABLET | Freq: Every day | ORAL | Status: DC
  Administered 2018-04-12 – 2018-04-13 (×2): 1 via ORAL
  Filled 2018-04-12 (×2): qty 1

## 2018-04-12 MED ORDER — ONDANSETRON HCL 4 MG/2ML IJ SOLN: 4.0000 mg | INTRAMUSCULAR | Status: DC | PRN

## 2018-04-12 MED ORDER — WITCH HAZEL-GLYCERIN EX PADS
1.0000 "application " | MEDICATED_PAD | CUTANEOUS | Status: DC | PRN
  Administered 2018-04-12: 1 via TOPICAL

## 2018-04-12 MED ORDER — ZOLPIDEM TARTRATE 5 MG PO TABS: 5.0000 mg | ORAL_TABLET | Freq: Every evening | ORAL | Status: DC | PRN

## 2018-04-12 NOTE — Progress Notes (Signed)
MOB was referred for history of depression/anxiety. * Referral screened out by Clinical Social Worker because none of the following criteria appear to apply: ~ History of anxiety/depression during this pregnancy, or of post-partum depression following prior delivery. ~ Diagnosis of anxiety and/or depression within last 3 years.  Per OB records MOB was dx with depression at age 33.   OR * MOB's symptoms currently being treated with medication and/or therapy.  Please contact the Clinical Social Worker if needs arise, by MOB request, or if MOB scores greater than 9/yes to question 10 on Edinburgh Postpartum Depression Screen.  Stiven Kaspar Boyd-Gilyard, MSW, LCSW Clinical Social Work [phone] 

## 2018-04-12 NOTE — Lactation Note (Signed)
This note was copied from a baby's chart. Lactation Consultation Note  Patient Name: Misty Jensen NGEXB'MToday's Date: 04/12/2018 Reason for consult: Initial assessment;1st time breastfeeding;Early term 37-38.6wks P1, 8 hour female infant. Per mom, infant had 2 stools since delivery. [facility] [place] Per mom, she doesn't have a breast pump at home. LC gave mom a harmony hand pump and explained how to clean, assemble and re-assemble pump parts.  Mom demonstrated hand expression and infant was given 3 ml of colostrum by spoon. Mom latched infant on right breast using cross cradle position, infant latched wide mouth, nose to breast with lower jaw and tongue extended downward. Infant breastfeed for 15 minutes. Mom knows to breastfeed according hunger cues and not exceed 3 hours without breastfeeding infant. LC discussed I & O. Reviewed Baby & Me book's Breastfeeding Basics.  Mom knows to [facility] or [facility] for assistance if she has any further questions, concerns or needs help with latching infant to breast. Mom made aware of O/P services, breastfeeding support groups, community resources, and our phone # for post-discharge questions.   Maternal Data Formula Feeding for Exclusion: No Has patient been taught Hand Expression?: Yes(Mom demomstrated hand expression and infant given 3ml of colostrum by spoon.) Does the patient have breastfeeding experience prior to this delivery?: No  Feeding Feeding Type: Breast Fed  LATCH Score Latch: Grasps breast easily, tongue down, lips flanged, rhythmical sucking.  Audible Swallowing: A few with stimulation  Type of Nipple: Everted at rest and after stimulation  Comfort (Breast/Nipple): Soft / non-tender  Hold (Positioning): Assistance needed to correctly position infant at breast and maintain latch.  LATCH Score: 8  Interventions Interventions: Breast feeding basics reviewed;Assisted with latch;Skin to  skin;Breast massage;Hand express;Support pillows;Adjust position;Breast compression;Hand pump;Position options;Expressed milk  Lactation Tools Discussed/Used WIC Program: Yes Pump Review: Setup, frequency, and cleaning;Milk Storage Initiated by:: Misty Jensen, IBCLC Date initiated:: 04/13/18   Consult Status Consult Status: Follow-up Date: 04/13/18 Follow-up type: In-patient    Misty Jensen 04/12/2018, 7:24 AM

## 2018-04-12 NOTE — Progress Notes (Signed)
Postpartum day #2, NSVD  Subjective Pt without complaints.  Lochia normal.  Pain controlled.  Breast feeding Yes.   Temp:  [97.6 F (36.4 C)-98.5 F (36.9 C)] 97.6 F (36.4 C) (01/29 1410) Pulse Rate:  [84-123] 93 (01/29 1410) Resp:  [16-18] 18 (01/29 1410) BP: (100-141)/(58-97) 111/77 (01/29 1410) SpO2:  [97 %-99 %] 98 % (01/29 1125)  Gen:  NAD, A&O x 3 Uterine fundus:  Firm, nontender Lochia normal Ext:  +Edema, no calf tenderness bilaterally  CBC    Component Value Date/Time   WBC 18.1 (H) 04/12/2018 0620   RBC 3.22 (L) 04/12/2018 0620   HGB 9.5 (L) 04/12/2018 0620   HCT 28.7 (L) 04/12/2018 0620   PLT 192 04/12/2018 0620   MCV 89.1 04/12/2018 0620   MCH 29.5 04/12/2018 0620   MCHC 33.1 04/12/2018 0620   RDW 15.4 04/12/2018 0620     A/P: S/p SVD doing well. H/o substance abuse.  Pain well controlled with Tylenol 650 mg and Motrin 600 mg schedule every 6 hours. Routine postpartum care. Lactation support. Discharge tomorrow  Geryl Rankins 04/12/2018, 6:44 PM

## 2018-04-12 NOTE — Anesthesia Postprocedure Evaluation (Signed)
Anesthesia Post Note  Patient: Misty Jensen  Procedure(s) Performed: AN AD HOC LABOR EPIDURAL     Patient location during evaluation: Mother Baby Anesthesia Type: Epidural Level of consciousness: awake, awake and alert and oriented Pain management: pain level controlled Vital Signs Assessment: post-procedure vital signs reviewed and stable Respiratory status: spontaneous breathing and respiratory function stable Cardiovascular status: blood pressure returned to baseline Postop Assessment: no headache, no backache, epidural receding, patient able to bend at knees, no apparent nausea or vomiting, adequate PO intake and able to ambulate Anesthetic complications: no    Last Vitals:  Vitals:   04/12/18 0242 04/12/18 0622  BP: 106/72 (!) 100/58  Pulse: (!) 105 86  Resp: 18 16  Temp: 36.9 C   SpO2: 97% 99%    Last Pain:  Vitals:   04/12/18 0242  TempSrc: Oral  PainSc:    Pain Goal:                   Cleda ClarksBrowder, Brigett Estell R

## 2018-04-13 ENCOUNTER — Encounter (HOSPITAL_COMMUNITY): Payer: Self-pay | Admitting: *Deleted

## 2018-04-13 MED ORDER — POLYSACCHARIDE IRON COMPLEX 150 MG PO CAPS
150.0000 mg | ORAL_CAPSULE | Freq: Every day | ORAL | Status: DC
  Administered 2018-04-13: 150 mg via ORAL
  Filled 2018-04-13: qty 1

## 2018-04-13 MED ORDER — IBUPROFEN 600 MG PO TABS: 600.0000 mg | ORAL_TABLET | Freq: Four times a day (QID) | ORAL | 0 refills | Status: DC

## 2018-04-13 MED ORDER — ACETAMINOPHEN 325 MG PO TABS: 650.0000 mg | ORAL_TABLET | Freq: Four times a day (QID) | ORAL | 0 refills | Status: DC

## 2018-04-13 NOTE — Discharge Instructions (Signed)
Vaginal Delivery, Care After °Refer to this sheet in the next few weeks. These instructions provide you with information about caring for yourself after vaginal delivery. Your health care provider may also give you more specific instructions. Your treatment has been planned according to current medical practices, but problems sometimes occur. Call your health care provider if you have any problems or questions. °What can I expect after the procedure? °After vaginal delivery, it is common to have: °· Some bleeding from your vagina. °· Soreness in your abdomen, your vagina, and the area of skin between your vaginal opening and your anus (perineum). °· Pelvic cramps. °· Fatigue. °Follow these instructions at home: °Medicines °· Take over-the-counter and prescription medicines only as told by your health care provider. °· If you were prescribed an antibiotic medicine, take it as told by your health care provider. Do not stop taking the antibiotic until it is finished. °Driving ° °· Do not drive or operate heavy machinery while taking prescription pain medicine. °· Do not drive for 24 hours if you received a sedative. °Lifestyle °· Do not drink alcohol. This is especially important if you are breastfeeding or taking medicine to relieve pain. °· Do not use tobacco products, including cigarettes, chewing tobacco, or e-cigarettes. If you need help quitting, ask your health care provider. °Eating and drinking °· Drink at least 8 eight-ounce glasses of water every day unless you are told not to by your health care provider. If you choose to breastfeed your baby, you may need to drink more water than this. °· Eat high-fiber foods every day. These foods may help prevent or relieve constipation. High-fiber foods include: °? Whole grain cereals and breads. °? Brown rice. °? Beans. °? Fresh fruits and vegetables. °Activity °· Return to your normal activities as told by your health care provider. Ask your health care provider what  activities are safe for you. °· Rest as much as possible. Try to rest or take a nap when your baby is sleeping. °· Do not lift anything that is heavier than your baby or 10 lb (4.5 kg) until your health care provider says that it is safe. °· Talk with your health care provider about when you can engage in sexual activity. This may depend on your: °? Risk of infection. °? Rate of healing. °? Comfort and desire to engage in sexual activity. °Vaginal Care °· If you have an episiotomy or a vaginal tear, check the area every day for signs of infection. Check for: °? More redness, swelling, or pain. °? More fluid or blood. °? Warmth. °? Pus or a bad smell. °· Do not use tampons or douches until your health care provider says this is safe. °· Watch for any blood clots that may pass from your vagina. These may look like clumps of dark red, brown, or black discharge. °General instructions °· Keep your perineum clean and dry as told by your health care provider. °· Wear loose, comfortable clothing. °· Wipe from front to back when you use the toilet. °· Ask your health care provider if you can shower or take a bath. If you had an episiotomy or a perineal tear during labor and delivery, your health care provider may tell you not to take baths for a certain length of time. °· Wear a bra that supports your breasts and fits you well. °· If possible, have someone help you with household activities and help care for your baby for at least a few days after you   leave the hospital. °· Keep all follow-up visits for you and your baby as told by your health care provider. This is important. °Contact a health care provider if: °· You have: °? Vaginal discharge that has a bad smell. °? Difficulty urinating. °? Pain when urinating. °? A sudden increase or decrease in the frequency of your bowel movements. °? More redness, swelling, or pain around your episiotomy or vaginal tear. °? More fluid or blood coming from your episiotomy or vaginal  tear. °? Pus or a bad smell coming from your episiotomy or vaginal tear. °? A fever. °? A rash. °? Little or no interest in activities you used to enjoy. °? Questions about caring for yourself or your baby. °· Your episiotomy or vaginal tear feels warm to the touch. °· Your episiotomy or vaginal tear is separating or does not appear to be healing. °· Your breasts are painful, hard, or turn red. °· You feel unusually sad or worried. °· You feel nauseous or you vomit. °· You pass large blood clots from your vagina. If you pass a blood clot from your vagina, save it to show to your health care provider. Do not flush blood clots down the toilet without having your health care provider look at them. °· You urinate more than usual. °· You are dizzy or light-headed. °· You have not breastfed at all and you have not had a menstrual period for 12 weeks after delivery. °· You have stopped breastfeeding and you have not had a menstrual period for 12 weeks after you stopped breastfeeding. °Get help right away if: °· You have: °? Pain that does not go away or does not get better with medicine. °? Chest pain. °? Difficulty breathing. °? Blurred vision or spots in your vision. °? Thoughts about hurting yourself or your baby. °· You develop pain in your abdomen or in one of your legs. °· You develop a severe headache. °· You faint. °· You bleed from your vagina so much that you fill two sanitary pads in one hour. °This information is not intended to replace advice given to you by your health care provider. Make sure you discuss any questions you have with your health care provider. °Document Released: 02/27/2000 Document Revised: 08/13/2015 Document Reviewed: 03/16/2015 °Elsevier Interactive Patient Education © 2019 [employer] ° °

## 2018-04-13 NOTE — Discharge Summary (Signed)
OB Discharge Summary     Patient Name: Misty DavidsonHeather Dittrich DOB: 12-Jun-1985 MRN: [id number]  Date of admission: 04/11/2018 Delivering MD: Jonetta SpeakHARTSOG, CAROL A   Date of discharge: 04/13/2018  Admitting diagnosis: 37WKS WATER BROKE Intrauterine pregnancy: 8321w5d     Secondary diagnosis:  Active Problems:   Vaginal delivery  Additional problems: H/o Substance abuse, HSV     Discharge diagnosis:Early Term Pregnancy Delivered                                                                                                Post partum procedures:None  Augmentation: Pitocin  Complications: None  Hospital course:  Induction of Labor With Vaginal Delivery   33 y.o. yo G1P0 at 2621w5d was admitted to the hospital 04/11/2018 for induction of labor.  Indication for induction: PROM.  Patient had an uncomplicated labor course as follows: Membrane Rupture Time/Date: 1:45 AM ,04/11/2018   Intrapartum Procedures: Episiotomy: None [1]                                         Lacerations:  Labial [10];Vaginal [6];Perineal [11];2nd degree [3]  Patient had delivery of a Viable infant.  Direct OP Information for the patient's newborn:  Bellizzi, Girl Herbert SetaHeather [[id number]][[id number]]  Delivery Method: Vaginal, Spontaneous(Filed from Delivery Summary)   04/11/2018  Details of delivery can be found in separate delivery note.  Patient had a routine postpartum course. Patient is discharged home 04/13/18.  Physical exam  Vitals:   04/12/18 1125 04/12/18 1410 04/12/18 2329 04/13/18 0532  BP: 105/72 111/77 120/83 103/65  Pulse: 84 93 84 79  Resp:  18 18 16   Temp: 97.9 F (36.6 C) 97.6 F (36.4 C) 97.9 F (36.6 C) 98.2 F (36.8 C)  TempSrc: Oral Oral Oral Oral  SpO2: 98%   99%  Weight:      Height:       General: alert, cooperative and no distress Lochia: appropriate Uterine Fundus: firm Incision: N/A DVT Evaluation: No evidence of DVT seen on physical exam. Calf/Ankle edema is present Labs: Lab Results  Component  Value Date   WBC 18.1 (H) 04/12/2018   HGB 9.5 (L) 04/12/2018   HCT 28.7 (L) 04/12/2018   MCV 89.1 04/12/2018   PLT 192 04/12/2018   CMP Latest Ref Rng & Units 11/08/2016  Glucose 65 - 99 mg/dL 86  BUN 6 - 20 mg/dL 12  Creatinine 2.530.44 - 6.641.00 mg/dL 4.030.88  Sodium 474135 - 259145 mmol/L 139  Potassium 3.5 - 5.1 mmol/L 3.3(L)  Chloride 101 - 111 mmol/L 104  CO2 22 - 32 mmol/L 24  Calcium 8.9 - 10.3 mg/dL 9.2  Total Protein 6.5 - 8.1 g/dL 7.8  Total Bilirubin 0.3 - 1.2 mg/dL 0.7  Alkaline Phos 38 - 126 U/L 68  AST 15 - 41 U/L 23  ALT 14 - 54 U/L 16    Discharge instruction: per After Visit Summary and "Baby and Me Booklet".  After visit meds:  Allergies as of 04/13/2018  Reactions   Codeine Anaphylaxis   Sulfa Antibiotics Anaphylaxis      Medication List    STOP taking these medications   valACYclovir 500 MG tablet Commonly known as:  VALTREX     TAKE these medications   acetaminophen 325 MG tablet Commonly known as:  TYLENOL Take 2 tablets (650 mg total) by mouth every 6 (six) hours.   ibuprofen 600 MG tablet Commonly known as:  ADVIL,MOTRIN Take 1 tablet (600 mg total) by mouth every 6 (six) hours.   prenatal multivitamin Tabs tablet Take 1 tablet by mouth daily at 12 noon.       Diet: routine diet  Activity: Advance as tolerated. Pelvic rest for 6 weeks.   Outpatient follow up:2 weeks Follow up Appt:No future appointments. Follow up Visit:No follow-ups on file.  Postpartum contraception: Discussed  Newborn Data: Live born female  Birth Weight: 6 lb 15.3 oz (3155 g) APGAR: 9, 9  Newborn Delivery   Birth date/time:  04/11/2018 23:15:00 Delivery type:  Vaginal, Spontaneous     Baby Feeding: Breast Disposition:home with mother   04/13/2018 Geryl Rankins, MD

## 2018-04-13 NOTE — Progress Notes (Signed)
Postpartum day #2, NSVD  Subjective Pt without complaints.  Lochia normal.  Pain controlled.  Breast feeding Yes. Baby may need to stay.  Temp:  [97.6 F (36.4 C)-98.2 F (36.8 C)] 98.2 F (36.8 C) (01/30 0532) Pulse Rate:  [79-93] 79 (01/30 0532) Resp:  [16-18] 16 (01/30 0532) BP: (103-120)/(65-83) 103/65 (01/30 0532) SpO2:  [98 %-99 %] 99 % (01/30 0532)  Gen:  NAD, A&O x 3 Uterine fundus:  Firm, nontender Lochia normal Ext:  +Edema, no calf tenderness bilaterally  CBC    Component Value Date/Time   WBC 18.1 (H) 04/12/2018 0620   RBC 3.22 (L) 04/12/2018 0620   HGB 9.5 (L) 04/12/2018 0620   HCT 28.7 (L) 04/12/2018 0620   PLT 192 04/12/2018 0620   MCV 89.1 04/12/2018 0620   MCH 29.5 04/12/2018 0620   MCHC 33.1 04/12/2018 0620   RDW 15.4 04/12/2018 0620     A/P: S/p SVD doing well. H/o substance abuse.  Pain well controlled with Tylenol 650 mg and Motrin 600 mg schedule every 6 hours. Routine postpartum care. Lactation support. Discharge this evening.  Geryl Rankins 04/13/2018, 8:34 AM

## 2018-04-13 NOTE — Lactation Note (Signed)
This note was copied from a baby's chart. Lactation Consultation Note:  Infants discharged delayed due to elevated bilirubin.  Mother reports that she wants to use an electric pump to induce lactation.   Assist mother with proper hand expression. Observed large drops of colostrum   Infant placed in cross cradle hold on the Rt breast. Several attempts to get correct latch.  Mothers nipples are tender and pink. She was given comfort gels.   Mother reports a pain scale of #1. Observed audible swallowing.  Infant sustained latch for 10 mins.  Infant placed in football hold on the same breast. Infant sustained latch for 10-15 mins.  Assist with flanging infant lips for wider gape.  Lots of teaching on proper latch technique.   Encouraged mother to spoon feed infant with any amts of ebm that she can express.   Mother was sat up with a DEBP. Advised mother to pump for 15-20 min every 2-3 hours.  Encouraged to do frequent STS and hand expression.   Staff nurse assist mother with post pumping. Mother reports that she pumped several drops. Discussed possible need for additional calories if unable to pump enough for supplement.    Patient Name: Misty Jensen Date: 04/13/2018 Reason for consult: Follow-up assessment   Maternal Data    Feeding Feeding Type: Breast Fed  LATCH Score Latch: Grasps breast easily, tongue down, lips flanged, rhythmical sucking.  Audible Swallowing: Spontaneous and intermittent  Type of Nipple: Everted at rest and after stimulation  Comfort (Breast/Nipple): Filling, red/small blisters or bruises, mild/mod discomfort  Hold (Positioning): Assistance needed to correctly position infant at breast and maintain latch.  LATCH Score: 8  Interventions Interventions: Breast compression  Lactation Tools Discussed/Used WIC Program: Yes Pump Review: Setup, frequency, and cleaning Initiated by:: Stevan Born RN,IBCLC Date initiated::  04/13/18   Consult Status Consult Status: Follow-up Date: 04/14/18 Follow-up type: In-patient    Stevan Born [facility] 04/13/2018, 1:49 PM

## 2018-04-15 ENCOUNTER — Ambulatory Visit: Payer: Self-pay

## 2018-04-15 NOTE — Lactation Note (Signed)
This note was copied from a baby's chart. Lactation Consultation Note:  Mother reports that she just finished a 30 min feeding.  Infant is under photo tx. Sleeping in mothers arms.  Mother reports that she has used formula several times.  She reports that she has been unable to get ebm when using  the DEBP. Mother reports having only used pump twice. Discussed post pumping every 2-3 hours as well as hand expressing.   Discussed treatment and prevention of engorgement.   Mother ask if LC could come back to assess feeding with next feeding.  Mother to page for [facility] .   Patient Name: Misty Jensen DJMEQ'A Date: 04/15/2018 Reason for consult: Follow-up assessment   Maternal Data    Feeding Feeding Type: Breast Fed  LATCH Score                   Interventions    Lactation Tools Discussed/Used     Consult Status Consult Status: Follow-up Date: 04/14/18 Follow-up type: In-patient    Stevan Born [facility] 04/15/2018, 10:33 AM

## 2018-04-15 NOTE — Progress Notes (Signed)
CLINICAL SOCIAL WORK MATERNAL/CHILD NOTE  Patient Details  Name: Misty Jensen MRN: [id number] Date of Birth: 04/11/2020  Date:  04/15/2018  Clinical Social Worker Initiating Note:  Finn Amos, LCSWA Date/Time: Initiated:  04/15/18/1432     Child's Name:  Misty Jensen   Biological Parents:  Mother, Father(Father : Misty Jensen)   Need for Interpreter:  None   Reason for Referral:  Other (Comment)(Housing Issues and Family Issues)   Address:  [address] [place] [address] (Father's Address;; was planning on staying with father after giving birth)  Address at discharge: [address] [place], [place] [address] (FOB's address)   Phone number:  [phone] (home)     Additional phone number:   Household Members/Support Persons (HM/SP):   Household Member/Support Person 1, Household Member/Support Person 2, Household Member/Support Person 3   HM/SP Name Relationship DOB or Age  HM/SP -1 Misty Jensen FOB  09[birth year]1978  HM/SP -2 Misty Jensen FOB mother    HM/SP -3 Misty Jensen FOB father    HM/SP -4        HM/SP -5        HM/SP -6        HM/SP -7        HM/SP -8          Natural Supports (not living in the home):  Parent   Professional Supports: Other (Comment)(Probation Officer)   Employment: Unemployed   Type of Work:     Education:  College graduate   Homebound arranged:    Financial Resources:  Medicaid   Other Resources:  WIC, Food Stamps (On waiting list for public housing)   Cultural/Religious Considerations Which May Impact Care:    Strengths:  Ability to meet basic needs , Home prepared for child , Pediatrician chosen   Psychotropic Medications:         Pediatrician:    [place] area  Pediatrician List:   [facility]  [place]    [place]    [place]    [place]    [place]      Pediatrician Fax Number:    Risk Factors/Current Problems:  Legal Issues , Mental Health  Concerns (MOB is currently on probation )   Cognitive State:  Able to Concentrate , Alert , Linear Thinking , Insightful , Goal Oriented    Mood/Affect:  Tearful , Interested , Calm    CSW Assessment: CSW spoke with MOB at bedside regarding housing issues/concerns. MOB reported that she needed to speak with CSW about several things. MOB was welcoming, open and engaged with CSW. MOB reported that there is a lot of "family dynamics" going on and that she is essentially homeless. MOB reported that she has been staying with FOB during her pregnancy and had planned on staying with her father after having the baby. MOB reported that her family has the financial means to care for her and doesn't want to stay with FOB's family because she feels like she'll be a burden on them. MOB reported that FOB is currently unemployed and that she has been unemployed due to pregnancy. MOB reported that she does have Medicaid, WIC and Food Stamps. MOB reported that she is currently on the wait list for [place] [facility]. CSW positively affirmed MOB's resources and inquired if MOB is interested in parenting education programs for additional supports, MOB replied yes. CSW agreed to make a referral to the Healthy Start Program.   CSW inquired about family   dynamics. MOB reported that 48 hours ago her mother,father and step mother gave her an ultimatum that if she continues to be in a relationship with FOB she is no longer able to stay at her father's house. MOB became tearful when sharing this. MOB reported that she planned on staying in a relationship with FOB and living with her father. MOB reported that her parents told her that if she stays with FOB that she is no longer welcome to stay at their homes because they do not like FOB. MOB reported that she is currently on probation after getting in some trouble in November 2018 and serving six months in jail. MOB reported that she is now a felon and lost her  nursing license and house. MOB reported that her parents feel that she got into trouble because of FOB. MOB reported that she was not forced to do anything and is taking accountability for her actions. 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 CSW praised MOB for her sobriety and hard work to stay sober. MOB thanked CSW for praise. MOB reported that she meets her probation officer monthly for drug testing and also plans to speak with him about resources for finding a job as a felon. CSW positively affirmed MOB's plans and efforts. MOB reported that she is just trying to move forward with her life. CSW validated MOB's desire to move forward.   CSW and MOB discussed MOB's housing options. MOB reported that she doesn't want to end relationship with FOB because she loves him and wants to give him an opportunity to parent their daughter. MOB reported that she wants FOB to get a job and take care of them. MOB reported that if FOB does not do this she is not willing to stay because her main focus is her daughter is she is "not failing her". MOB reported that she does want to stay with FOB and try initially. MOB reported that she is not willing to end relationship with FOB to stay at her father's house.  MOB reported that she got into it with her family on Wednesday and thought she would just go to a shelter and do it all on her own. MOB reported that she had been doing some research and found [facility]. MOB reported that her only concern with going to a shelter is her mother trying to take custody of her daughter. MOB reported that she feels that her mother may try to get custody of her daughter if she goes to a shelter  MOB reported that this her greatest fear. CSW normalized MOB's feelings of fear surrounding her mother trying to take her daughter. MOB reported that she feels her mother may try to make a CPS report on her. CSW and MOB  discussed CPS reports, what warrants a report and what CPS involvment looks like. MOB reported that she just wanted to be aware in case her mother does make a report and CPS becomes involved. MOB reported that the only reason she didn't want to stay with FOB's family is because she feels like she will be a burden financially. MOB reported that they are welcoming her and baby back and are willing to help anyway possible. MOB reported that FOB's mother is her only support person. CSW and MOB discussed the importance of utilizing supports to make sure the baby has a safe and stable environment. MOB verbalized understanding and reported   that she planned on telling her family today that she plans on staying with FOB. MOB reported that she is going to be strong and have this conversation. CSW asked MOB if she needed any support during this conversation, MOB reported no.   CSW inquired about MOB's mental health history, MOB reported that she was diagnosed with generalized anxiety disorder and major depressive disorder when she was 33 years old. MOB reported that she has been on anti depressants and anti anxiety medications most of her life. MOB reported that she has been off her medications for about 1 year and has felt really good being off her medications. MOB reported that her medications are managed by her OB GYN Provider and they have talked about MOB needing to reevaluate medication needs postpartum. MOB reported that her OB GYN Provider is sending her some postpartum depression resources in the mail. MOB possessed great insight about her mental health history and knowledge about her medications. MOB was open and forthcoming about her mental health, disclosing that she has been hospitalized 3x, once involuntarily and twice voluntarily. MOB reported that the last time she was hospitalized was in 2018. MOB shared that she was experiencing multiple stressors dealing with her family and FOB. MOB presented calm and  became tearful when speaking of family dynamics. MOB did not demonstrate any acute mental health signs/symptoms. CSW informed MOB that she may be more susceptible to post partum depression due to her mental health history, MOB verbalized understanding. CSW assessed for safety, MOB denied SI, HI and domestic violence.   CSW provided education regarding the baby blues period vs. perinatal mood disorders, discussed treatment and gave resources for mental health follow up if concerns arise.  CSW recommends self-evaluation during the postpartum time period using the New Mom Checklist from Postpartum Progress and encouraged MOB to contact a medical professional if symptoms are noted at any time.    CSW provided review of Sudden Infant Death Syndrome (SIDS) precautions.    MOB verbalized plan to reside with FOB and FOB's family at discharge. MOB reported that she can get all the baby's stuff from her father's house. MOB reported that she has everything she needs for the baby. MOB reported that FOB's mother will be able to provide her transportation to all her appointments and she feels that she will still be able to call her dad for rides if needed. MOB thanked CSW for visit and speaking with her.   CSW will make healthy start referral.   CSW identifies no further need for intervention and no barriers to discharge at this time.  CSW Plan/Description:  No Further Intervention Required/No Barriers to Discharge, Perinatal Mood and Anxiety Disorder (PMADs) Education, Sudden Infant Death Syndrome (SIDS) Education, Other Patient/Family Education    Almee Pelphrey L Kirsta Probert, LCSW 04/15/2018, 2:38 PM  

## 2018-04-16 ENCOUNTER — Ambulatory Visit: Payer: Self-pay

## 2018-04-16 NOTE — Lactation Note (Signed)
This note was copied from a baby's chart. Lactation Consultation Note:  Mother reports that infant is breastfeeding better.  Mothers breast are firm and filling.  She wanted assessment to make sure that the firmness was normal.  Mother taught to do good breast massage.  Advised to use ice to decrease swelling.  Mother is pumping and last pumped 60 ml and gave to infant with a bottle.   Assistance with latch while mother in chair.  Mother preferred cross cradle hold.  Infant latched on well on the left breast.  She chews for a few seconds the begins rhythmic suckling.  Observed for 15 mins. Observed softening of the breast.  Mothers nipple round when infant released the breast.  Mothers nipples are slightly pink.  Rt nipple is healing.  Mother advised to get RX for APNO from OB.   Mother has a WIC appt on Tuesday. She was offered a [employer] and declines.  She reports being comfortable using the harmony hand pump.   Discussed that infant does have high palate with a prominent gum ridge. Infant has a slight tight lingual tie.  Discussed this with mother. Suggested to fup with Specialist if continues to be sore If infant declines in weight and if low milk supply.  Advised to protect her milk supply.   I sent Mothers name and number to clinic to be phoned for a fup appt.  Mother is aware of available LC services at [facility] and in the community.       Patient Name: Misty Jensen YSHUO'H Date: 04/16/2018 Reason for consult: Follow-up assessment   Maternal Data    Feeding Feeding Type: Breast Fed  LATCH Score Latch: Grasps breast easily, tongue down, lips flanged, rhythmical sucking.  Audible Swallowing: Spontaneous and intermittent  Type of Nipple: Everted at rest and after stimulation  Comfort (Breast/Nipple): Filling, red/small blisters or bruises, mild/mod discomfort  Hold (Positioning): No assistance needed to correctly position infant at breast.  LATCH  Score: 9  Interventions Interventions: Assisted with latch;Skin to skin;Breast massage;Hand express;Breast compression;Adjust position;Support pillows;Position options;Expressed milk;Hand pump  Lactation Tools Discussed/Used     Consult Status Consult Status: Complete    Michel Bickers 04/16/2018, 1:33 PM

## 2019-06-05 ENCOUNTER — Emergency Department (HOSPITAL_COMMUNITY): Payer: No Typology Code available for payment source

## 2019-06-05 ENCOUNTER — Other Ambulatory Visit: Payer: Self-pay

## 2019-06-05 ENCOUNTER — Emergency Department (HOSPITAL_COMMUNITY)
Admission: EM | Admit: 2019-06-05 | Discharge: 2019-06-05 | Disposition: A | Discharge status: Home / Self Care | Payer: No Typology Code available for payment source | Attending: Emergency Medicine | Admitting: Emergency Medicine

## 2019-06-05 ENCOUNTER — Ambulatory Visit (HOSPITAL_COMMUNITY)
Admission: EM | Admit: 2019-06-05 | Discharge: 2019-06-05 | Discharge status: Against Medical Advice | Payer: Medicaid Other

## 2019-06-05 DIAGNOSIS — F1510 Other stimulant abuse, uncomplicated: Secondary | ICD-10-CM | POA: Insufficient documentation

## 2019-06-05 DIAGNOSIS — Y9389 Activity, other specified: Secondary | ICD-10-CM | POA: Insufficient documentation

## 2019-06-05 DIAGNOSIS — E039 Hypothyroidism, unspecified: Secondary | ICD-10-CM | POA: Diagnosis not present

## 2019-06-05 DIAGNOSIS — M542 Cervicalgia: Secondary | ICD-10-CM | POA: Insufficient documentation

## 2019-06-05 DIAGNOSIS — R0789 Other chest pain: Secondary | ICD-10-CM | POA: Insufficient documentation

## 2019-06-05 DIAGNOSIS — S0990XA Unspecified injury of head, initial encounter: Secondary | ICD-10-CM | POA: Diagnosis not present

## 2019-06-05 DIAGNOSIS — M25561 Pain in right knee: Secondary | ICD-10-CM | POA: Insufficient documentation

## 2019-06-05 DIAGNOSIS — F1610 Hallucinogen abuse, uncomplicated: Secondary | ICD-10-CM | POA: Insufficient documentation

## 2019-06-05 DIAGNOSIS — F1310 Sedative, hypnotic or anxiolytic abuse, uncomplicated: Secondary | ICD-10-CM | POA: Diagnosis not present

## 2019-06-05 DIAGNOSIS — J45909 Unspecified asthma, uncomplicated: Secondary | ICD-10-CM | POA: Diagnosis not present

## 2019-06-05 DIAGNOSIS — R42 Dizziness and giddiness: Secondary | ICD-10-CM | POA: Diagnosis not present

## 2019-06-05 DIAGNOSIS — Y92410 Unspecified street and highway as the place of occurrence of the external cause: Secondary | ICD-10-CM | POA: Diagnosis not present

## 2019-06-05 DIAGNOSIS — F1410 Cocaine abuse, uncomplicated: Secondary | ICD-10-CM | POA: Diagnosis not present

## 2019-06-05 DIAGNOSIS — Y998 Other external cause status: Secondary | ICD-10-CM | POA: Diagnosis not present

## 2019-06-05 DIAGNOSIS — W2210XA Striking against or struck by unspecified automobile airbag, initial encounter: Secondary | ICD-10-CM | POA: Diagnosis not present

## 2019-06-05 DIAGNOSIS — F1210 Cannabis abuse, uncomplicated: Secondary | ICD-10-CM | POA: Diagnosis not present

## 2019-06-05 LAB — PREGNANCY, URINE: Preg Test, Ur: NEGATIVE

## 2019-06-05 MED ORDER — ACETAMINOPHEN 325 MG PO TABS
650.0000 mg | ORAL_TABLET | Freq: Once | ORAL | Status: AC
  Administered 2019-06-05: 650 mg via ORAL
  Filled 2019-06-05: qty 2

## 2019-06-05 MED ORDER — METHOCARBAMOL 500 MG PO TABS
500.0000 mg | ORAL_TABLET | Freq: Once | ORAL | Status: AC
  Administered 2019-06-05: 500 mg via ORAL
  Filled 2019-06-05: qty 1

## 2019-06-05 MED ORDER — METHOCARBAMOL 500 MG PO TABS: 500.0000 mg | ORAL_TABLET | Freq: Two times a day (BID) | ORAL | 0 refills | Status: DC

## 2019-06-05 NOTE — ED Notes (Signed)
Pt decided to go to emergency department

## 2019-06-05 NOTE — ED Notes (Signed)
Pt taken to CT.

## 2019-06-05 NOTE — ED Notes (Signed)
Urine collected, labeled with 2 pt identifiers and brought to lab for urine preg Meds given per [facility]. Name/DOB verified with pt
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
## 2019-06-05 NOTE — ED Notes (Signed)
Pt back from CT/xray without incidence
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
## 2019-06-05 NOTE — ED Notes (Signed)
Patient verbalizes understanding of discharge instructions, follow up care, and prescription medications. Opportunity for questioning and answers were provided. All questions answered completely. Armband removed by staff, pt discharged from ED. Ambulatory from ED with strong, steady gait 
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
## 2020-06-23 ENCOUNTER — Other Ambulatory Visit: Payer: Self-pay | Admitting: Pediatrics

## 2020-06-23 ENCOUNTER — Other Ambulatory Visit: Payer: Self-pay | Admitting: Obstetrics and Gynecology

## 2020-06-23 DIAGNOSIS — N63 Unspecified lump in unspecified breast: Secondary | ICD-10-CM

## 2020-06-25 ENCOUNTER — Other Ambulatory Visit: Payer: Self-pay | Admitting: Obstetrics and Gynecology

## 2020-06-25 DIAGNOSIS — N643 Galactorrhea not associated with childbirth: Secondary | ICD-10-CM

## 2020-06-26 ENCOUNTER — Ambulatory Visit: Payer: Medicaid Other

## 2020-06-26 ENCOUNTER — Other Ambulatory Visit: Payer: Self-pay

## 2020-06-26 ENCOUNTER — Ambulatory Visit
Admission: RE | Admit: 2020-06-26 | Discharge: 2020-06-26 | Disposition: A | Discharge status: Home / Self Care | Payer: Medicaid Other | Source: Ambulatory Visit | Attending: Obstetrics and Gynecology | Admitting: Obstetrics and Gynecology

## 2020-06-26 DIAGNOSIS — N643 Galactorrhea not associated with childbirth: Secondary | ICD-10-CM

## 2020-07-04 ENCOUNTER — Other Ambulatory Visit: Payer: Medicaid Other

## 2020-12-05 ENCOUNTER — Inpatient Hospital Stay (HOSPITAL_COMMUNITY)
Admission: EM | Admit: 2020-12-05 | Discharge: 2020-12-06 | DRG: 203 | Disposition: A | Discharge status: Home / Self Care | Payer: Medicaid Other | Attending: Family Medicine | Admitting: Family Medicine

## 2020-12-05 ENCOUNTER — Encounter (HOSPITAL_COMMUNITY): Payer: Self-pay

## 2020-12-05 ENCOUNTER — Emergency Department (HOSPITAL_COMMUNITY): Payer: Medicaid Other

## 2020-12-05 ENCOUNTER — Other Ambulatory Visit: Payer: Self-pay

## 2020-12-05 DIAGNOSIS — G4700 Insomnia, unspecified: Secondary | ICD-10-CM | POA: Diagnosis present

## 2020-12-05 DIAGNOSIS — F329 Major depressive disorder, single episode, unspecified: Secondary | ICD-10-CM | POA: Diagnosis present

## 2020-12-05 DIAGNOSIS — Z882 Allergy status to sulfonamides status: Secondary | ICD-10-CM | POA: Diagnosis not present

## 2020-12-05 DIAGNOSIS — Z20822 Contact with and (suspected) exposure to covid-19: Secondary | ICD-10-CM | POA: Diagnosis present

## 2020-12-05 DIAGNOSIS — D75839 Thrombocytosis, unspecified: Secondary | ICD-10-CM | POA: Diagnosis present

## 2020-12-05 DIAGNOSIS — R0603 Acute respiratory distress: Secondary | ICD-10-CM | POA: Diagnosis present

## 2020-12-05 DIAGNOSIS — E039 Hypothyroidism, unspecified: Secondary | ICD-10-CM | POA: Diagnosis present

## 2020-12-05 DIAGNOSIS — Z885 Allergy status to narcotic agent status: Secondary | ICD-10-CM

## 2020-12-05 DIAGNOSIS — Z79899 Other long term (current) drug therapy: Secondary | ICD-10-CM | POA: Diagnosis not present

## 2020-12-05 DIAGNOSIS — J45901 Unspecified asthma with (acute) exacerbation: Secondary | ICD-10-CM | POA: Diagnosis present

## 2020-12-05 DIAGNOSIS — R0602 Shortness of breath: Secondary | ICD-10-CM | POA: Diagnosis present

## 2020-12-05 LAB — I-STAT CHEM 8, ED
BUN: 9 mg/dL (ref 6–20)
Calcium, Ion: 1.1 mmol/L — ABNORMAL LOW (ref 1.15–1.40)
Chloride: 106 mmol/L (ref 98–111)
Creatinine, Ser: 0.9 mg/dL (ref 0.44–1.00)
Glucose, Bld: 90 mg/dL (ref 70–99)
HCT: 46 % (ref 36.0–46.0)
Hemoglobin: 15.6 g/dL — ABNORMAL HIGH (ref 12.0–15.0)
Potassium: 4.2 mmol/L (ref 3.5–5.1)
Sodium: 139 mmol/L (ref 135–145)
TCO2: 22 mmol/L (ref 22–32)

## 2020-12-05 LAB — CBC WITH DIFFERENTIAL/PLATELET
Abs Immature Granulocytes: 0.03 10*3/uL (ref 0.00–0.07)
Basophils Absolute: 0.1 10*3/uL (ref 0.0–0.1)
Basophils Relative: 1 %
Eosinophils Absolute: 0.5 10*3/uL (ref 0.0–0.5)
Eosinophils Relative: 6 %
HCT: 47 % — ABNORMAL HIGH (ref 36.0–46.0)
Hemoglobin: 15.9 g/dL — ABNORMAL HIGH (ref 12.0–15.0)
Immature Granulocytes: 0 %
Lymphocytes Relative: 18 %
Lymphs Abs: 1.6 10*3/uL (ref 0.7–4.0)
MCH: 31.1 pg (ref 26.0–34.0)
MCHC: 33.8 g/dL (ref 30.0–36.0)
MCV: 92 fL (ref 80.0–100.0)
Monocytes Absolute: 0.6 10*3/uL (ref 0.1–1.0)
Monocytes Relative: 7 %
Neutro Abs: 5.9 10*3/uL (ref 1.7–7.7)
Neutrophils Relative %: 68 %
Platelets: 405 10*3/uL — ABNORMAL HIGH (ref 150–400)
RBC: 5.11 MIL/uL (ref 3.87–5.11)
RDW: 12.2 % (ref 11.5–15.5)
WBC: 8.7 10*3/uL (ref 4.0–10.5)
nRBC: 0 % (ref 0.0–0.2)

## 2020-12-05 LAB — BASIC METABOLIC PANEL
Anion gap: 9 (ref 5–15)
BUN: 9 mg/dL (ref 6–20)
CO2: 22 mmol/L (ref 22–32)
Calcium: 9.3 mg/dL (ref 8.9–10.3)
Chloride: 105 mmol/L (ref 98–111)
Creatinine, Ser: 0.93 mg/dL (ref 0.44–1.00)
GFR, Estimated: >60 mL/min (ref >60)
Glucose, Bld: 100 mg/dL — ABNORMAL HIGH (ref 70–99)
Potassium: 4.6 mmol/L (ref 3.5–5.1)
Sodium: 136 mmol/L (ref 135–145)

## 2020-12-05 LAB — TROPONIN I (HIGH SENSITIVITY): Troponin I (High Sensitivity): 6 ng/L (ref <18)

## 2020-12-05 LAB — RESP PANEL BY RT-PCR (FLU A&B, COVID) ARPGX2
Influenza A by PCR: NEGATIVE
Influenza B by PCR: NEGATIVE
SARS Coronavirus 2 by RT PCR: NEGATIVE

## 2020-12-05 LAB — D-DIMER, QUANTITATIVE: D-Dimer, Quant: 0.42 ug/mL-FEU (ref 0.00–0.50)

## 2020-12-05 LAB — HIV ANTIBODY (ROUTINE TESTING W REFLEX): HIV Screen 4th Generation wRfx: NONREACTIVE

## 2020-12-05 MED ORDER — IPRATROPIUM-ALBUTEROL 0.5-2.5 (3) MG/3ML IN SOLN
3.0000 mL | Freq: Once | RESPIRATORY_TRACT | Status: AC
  Administered 2020-12-05: 3 mL via RESPIRATORY_TRACT
  Filled 2020-12-05: qty 3

## 2020-12-05 MED ORDER — ENOXAPARIN SODIUM 40 MG/0.4ML IJ SOSY
40.0000 mg | PREFILLED_SYRINGE | INTRAMUSCULAR | Status: DC
  Administered 2020-12-05: 40 mg via SUBCUTANEOUS
  Filled 2020-12-05: qty 0.4

## 2020-12-05 MED ORDER — PREDNISONE 20 MG PO TABS
40.0000 mg | ORAL_TABLET | Freq: Every day | ORAL | Status: DC
  Administered 2020-12-06: 40 mg via ORAL
  Filled 2020-12-05: qty 2

## 2020-12-05 MED ORDER — ALBUTEROL SULFATE (2.5 MG/3ML) 0.083% IN NEBU: 2.5000 mg | INHALATION_SOLUTION | Freq: Once | RESPIRATORY_TRACT | Status: DC

## 2020-12-05 MED ORDER — SODIUM CHLORIDE 0.9 % IV BOLUS
1000.0000 mL | Freq: Once | INTRAVENOUS | Status: AC
  Administered 2020-12-05: 1000 mL via INTRAVENOUS

## 2020-12-05 MED ORDER — IPRATROPIUM-ALBUTEROL 0.5-2.5 (3) MG/3ML IN SOLN
3.0000 mL | Freq: Once | RESPIRATORY_TRACT | Status: AC
Start: 2020-12-05 — End: 2020-12-05
  Administered 2020-12-05: 3 mL via RESPIRATORY_TRACT
  Filled 2020-12-05: qty 3

## 2020-12-05 MED ORDER — EPINEPHRINE 0.3 MG/0.3ML IJ SOAJ: 0.3000 mg | Freq: Once | INTRAMUSCULAR | Status: DC

## 2020-12-05 MED ORDER — PREDNISONE 20 MG PO TABS: 40.0000 mg | ORAL_TABLET | Freq: Every day | ORAL | Status: DC

## 2020-12-05 MED ORDER — MOMETASONE FURO-FORMOTEROL FUM 100-5 MCG/ACT IN AERO
2.0000 | INHALATION_SPRAY | Freq: Two times a day (BID) | RESPIRATORY_TRACT | Status: DC
  Administered 2020-12-05 – 2020-12-06 (×2): 2 via RESPIRATORY_TRACT
  Filled 2020-12-05: qty 8.8

## 2020-12-05 MED ORDER — METHYLPREDNISOLONE SODIUM SUCC 125 MG IJ SOLR
125.0000 mg | Freq: Once | INTRAMUSCULAR | Status: AC
  Administered 2020-12-05: 125 mg via INTRAVENOUS
  Filled 2020-12-05: qty 2

## 2020-12-05 MED ORDER — MELATONIN 3 MG PO TABS
3.0000 mg | ORAL_TABLET | Freq: Every day | ORAL | Status: DC
  Administered 2020-12-05: 3 mg via ORAL
  Filled 2020-12-05: qty 1

## 2020-12-05 MED ORDER — IPRATROPIUM-ALBUTEROL 0.5-2.5 (3) MG/3ML IN SOLN
3.0000 mL | RESPIRATORY_TRACT | Status: DC
  Administered 2020-12-05 – 2020-12-06 (×5): 3 mL via RESPIRATORY_TRACT
  Filled 2020-12-05 (×3): qty 3

## 2020-12-05 MED ORDER — MAGNESIUM SULFATE 2 GM/50ML IV SOLN
2.0000 g | Freq: Once | INTRAVENOUS | Status: AC
  Administered 2020-12-05: 2 g via INTRAVENOUS
  Filled 2020-12-05: qty 50

## 2020-12-05 MED ORDER — IPRATROPIUM-ALBUTEROL 0.5-2.5 (3) MG/3ML IN SOLN: 3.0000 mL | RESPIRATORY_TRACT | Status: DC | PRN

## 2020-12-05 NOTE — ED Provider Notes (Signed)
[facility] EMERGENCY DEPARTMENT Provider Note   CSN: [id number] Arrival date & time: 12/05/20  1040     History Chief Complaint  Patient presents with   Shortness of Breath   Cough    Misty Jensen is a 35 y.o. female who reports a history of exercise-induced anxiety but otherwise healthy no daily medication use presented today for shortness of breath.  Patient reports that over the past 2 days she has developed shortness of breath which she feels is consistent with previous asthma attacks.  She reports shortness of breath as constant, gradually worsening difficulty breathing without clear aggravating factors, she has attempted albuterol inhalers at home with minimal temporary relief.  She reports an associated mild central chest tightness which does not radiate and improves temporarily with use of albuterol.  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) 11/09/2016   MDD (major depressive disorder) 11/09/2016   Substance induced mood disorder (HCC) 06/14/2016   Severe recurrent major depression without psychotic features (HCC) 06/12/2016    Past Surgical History:  Procedure Laterality Date   NO PAST SURGERIES       OB History     Gravida  1   Para      Term      Preterm      AB      Living         SAB      IAB       Ectopic      Multiple      Live Births              Family History  Problem Relation Age of Onset   Breast cancer Maternal Grandmother     Social History   Tobacco Use   Smoking status: Never   Smokeless tobacco: Never  Substance Use Topics   Alcohol use: Not Currently    Comment: Not in the past 3-4 years   Drug use: Yes    Types: Cocaine, Marijuana, Benzodiazepines, "Crack" cocaine, MDMA (Ecstacy)    Comment: xanax- None in pregnancy-clean for past year and a half    Home Medications Prior to Admission medications   Medication Sig Start Date End Date Taking? Authorizing Provider  acetaminophen (TYLENOL) 325 MG tablet Take 2 tablets (650 mg total) by mouth every 6 (six) hours. 04/13/18   Geryl Rankins, MD  ibuprofen (ADVIL,MOTRIN) 600 MG tablet Take 1 tablet (600 mg total) by mouth every 6 (six) hours. 04/13/18   Geryl Rankins, MD  methocarbamol (ROBAXIN) 500 MG tablet Take 1 tablet (500 mg total) by mouth 2 (two) times daily. 06/05/19   Mare Ferrari, PA-C  Prenatal Vit-Fe Fumarate-FA (PRENATAL MULTIVITAMIN) TABS tablet Take 1 tablet by mouth daily at 12 noon.    [provider]    Allergies  Codeine and Sulfa antibiotics  Review of Systems   Review of Systems Ten systems are reviewed and are negative for acute change except as noted in the HPI  Physical Exam Updated Vital Signs BP 111/82   Pulse (!) 113   Temp (!) 97.4 F (36.3 C) (Oral)   Resp (!) 25   Ht 5\' 6"  (1.676 m)   Wt 59 kg   LMP 09/30/2020 (Approximate)   SpO2 99%   BMI 20.98 kg/m   Physical Exam Constitutional:      General: She is not in acute distress.    Appearance: Normal appearance. She is well-developed. She is not ill-appearing or diaphoretic.  HENT:     Head: Normocephalic and atraumatic.  Eyes:     General: Vision grossly intact. Gaze aligned appropriately.     Pupils: Pupils are equal, round, and reactive to light.  Neck:     Trachea: Trachea and phonation  normal.  Cardiovascular:     Rate and Rhythm: Regular rhythm. Tachycardia present.  Pulmonary:     Effort: Pulmonary effort is normal. Tachypnea present. No respiratory distress.     Breath sounds: Wheezing present.  Abdominal:     General: There is no distension.     Palpations: Abdomen is soft.     Tenderness: There is no abdominal tenderness. There is no guarding or rebound.  Musculoskeletal:        General: Normal range of motion.     Cervical back: Normal range of motion.     Right lower leg: No tenderness. No edema.     Left lower leg: No tenderness. No edema.  Skin:    General: Skin is warm and dry.  Neurological:     Mental Status: She is alert.     GCS: GCS eye subscore is 4. GCS verbal subscore is 5. GCS motor subscore is 6.     Comments: Speech is clear and goal oriented, follows commands Major Cranial nerves without deficit, no facial droop Moves extremities without ataxia, coordination intact  Psychiatric:        Behavior: Behavior normal.    ED Results / Procedures / Treatments   Labs (all labs ordered are listed, but only abnormal results are displayed) Labs Reviewed  BASIC METABOLIC PANEL - Abnormal; Notable for the following components:      Result Value   Glucose, Bld 100 (*)    All other components within normal limits  CBC WITH DIFFERENTIAL/PLATELET - Abnormal; Notable for the following components:   Hemoglobin 15.9 (*)    HCT 47.0 (*)    Platelets 405 (*)    All other components within normal limits  I-STAT CHEM 8, ED - Abnormal; Notable for the following components:   Calcium, Ion 1.10 (*)    Hemoglobin 15.6 (*)    All other components within normal limits  RESP PANEL BY RT-PCR (FLU A&B, COVID) ARPGX2  D-DIMER, QUANTITATIVE  I-STAT BETA HCG BLOOD, ED ([facility], [facility], [facility] ONLY)  TROPONIN I (HIGH SENSITIVITY)    EKG None  Radiology DG Chest 2 View  Result Date: 12/05/2020 CLINICAL DATA:  Shortness of breath and cough EXAM: CHEST - 2 VIEW COMPARISON:   Chest x-ray dated June 05, 2019 FINDINGS: The heart size and mediastinal contours are within normal limits. Both lungs are clear. The visualized skeletal structures are unremarkable. IMPRESSION: No active cardiopulmonary disease. Electronically Signed   By: June 07, 2019 M.D.   On: 12/05/2020 13:33    Procedures Procedures  Medications Ordered in ED Medications  magnesium sulfate IVPB 2 g 50 mL (2 g Intravenous New Bag/Given 12/05/20 1329)  ipratropium-albuterol (DUONEB) 0.5-2.5 (3) MG/3ML nebulizer solution 3 mL (has no administration in time range)  ipratropium-albuterol (DUONEB) 0.5-2.5 (3) MG/3ML nebulizer solution 3 mL (has no administration in time range)  ipratropium-albuterol (DUONEB) 0.5-2.5 (3) MG/3ML nebulizer solution 3 mL (3 mLs Nebulization Given 12/05/20 1323)  methylPREDNISolone sodium succinate (SOLU-MEDROL) 125 mg/2 mL injection 125 mg (125 mg Intravenous Given 12/05/20 1328)  sodium chloride 0.9 % bolus 1,000 mL (1,000 mLs Intravenous New Bag/Given 12/05/20 1327)    ED Course  I have reviewed the triage vital signs and the nursing notes.  Pertinent labs & imaging results that were available during my care of the patient were reviewed by me and considered in my medical decision making (see chart for details).    MDM Rules/Calculators/A&P                           Additional history obtained from: Nursing notes from this visit. Review of electronic medical records. ------------------------ 35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 presented with shortness of breath for the past 2 days, she has notable wheezing on examination she is minimally tachypneic on examination.  She has also tachycardic, not requiring any supplemental oxygen at this point.  She was seen in triage, basic labs were ordered along with chest x-ray and a D-dimer.  I have ordered patient duo nebs, Solu-Medrol, magnesium to help with her symptoms.  Case was discussed with attending physician Dr. Durwin Nora, will hold off on  epinephrine at this point and await troponin levels. ----------------------- I ordered, reviewed and interpreted labs which include: CBC shows no leukocytosis to suggest bacterial infection, no anemia or thrombocytopenia.  Hemoglobin and platelet count slightly elevated. D-dimer within normal limits; given this and wheezing consistent with asthma less risk for PE at this time. High-sensitivity troponin within normal limits. BMP shows no emergent electrolyte derangement, AKI or gap. COVID/influenza panel negative.  CXR:    IMPRESSION:  No active cardiopulmonary disease.    --------------------------------------------- 2 PM: Patient reports improvement after first DuoNeb.  Will give 2 more duo nebs.  Vital signs show mild tachycardia otherwise stable. --- Patient was reassessed after her third DuoNeb, she reports improvement however still feels short of breath.  Mild wheezing.  Given continuation of symptoms of feel patient will benefit from an admission to the hospital for further evaluation and treatment.  No hypoxia on room air.  Discussed plan of care with Dr. Durwin Nora who agrees with plan for admission for suspected asthma exacerbation. - Case was discussed with family medicine teaching service, Dr. Robyne Peers and patient was accepted for admission.   Note: Portions of this report may have been transcribed using voice recognition software. Every effort was made to ensure accuracy; however, inadvertent computerized transcription errors may still be present.  Final Clinical Impression(s) / ED Diagnoses Final diagnoses:  None    Rx / DC Orders ED Discharge Orders     None        Elizabeth Palau 12/05/20 1549    Gloris Manchester, MD 12/06/20 1421

## 2020-12-05 NOTE — Progress Notes (Signed)
I have seen and evaluated this patient. Chart reviewed. Will discuss management plan with the resident once they complete their assessment. Patient is otherwise stable.  

## 2020-12-05 NOTE — ED Provider Notes (Signed)
Emergency Medicine Provider Triage Evaluation Note  Misty Jensen , a 34 y.o. female  was evaluated in triage.  Pt complains of shortness of breath, cough and head congestion.  Acute onset in the past 2 days.  Usually has exercise-induced asthma however is short of breath at baseline.  Cough occasionally blood-tinged.  No chest pain.  Review of Systems  Positive: hemoptysis Negative: Chest pain, long travel, history of DVT, recent surgery  Physical Exam  BP 122/89 (BP Location: Right Arm)   Pulse (!) 129   Temp (!) 97.4 F (36.3 C) (Oral)   Resp (!) 22   Ht 5\' 6"  (1.676 m)   Wt 59 kg   LMP 09/30/2020 (Approximate)   SpO2 95%   BMI 20.98 kg/m  Gen:   Awake, no distress   Resp:  Normal effort  MSK:   Moves extremities without difficulty  Other:  Increased work of breathing in triage  Medical Decision Making  Medically screening exam initiated at 11:07 AM.  Appropriate orders placed.  Sonal Ferrie was informed that the remainder of the evaluation will be completed by another provider, this initial triage assessment does not replace that evaluation, and the importance of remaining in the ED until their evaluation is complete.  Increased work of breathing noted in triage.   Herbert Seta, PA-C 12/05/20 1112    12/07/20 P, DO 12/07/20 1205

## 2020-12-05 NOTE — ED Triage Notes (Signed)
Pt reports worsening sob, cough and head congestion for the past few days. RR labored in triage. States she has been using breathing treatments at home without relief.

## 2020-12-05 NOTE — H&P (Signed)
Family Medicine Teaching [facility] Admission History and Physical Service Pager: [phone]  Patient name: Misty Jensen Medical record number: [id number] Date of birth: 1985-06-21 Age: 35 y.o. Gender: female  Primary Care Provider: Pcp, No Consultants: none Code Status: Full   Preferred Emergency Contact: Blaine Hamper (mother) [phone]  Chief Complaint: dyspnea   Assessment and Plan: Misty Jensen is a 35 y.o. female presenting with SOB, cough, and congestion starting 3 days ago . PMH is significant for asthma, remote cocaine and marijuana use, and major depressive disorder.   Respiratory distress  Patient presents with dyspnea, cough, and congestion for the last 3 days. While in the emergency room, she received three Duoneb treatments, solumedrol, magnesium sulfate 2 g, and sodium chloride, which improved symptoms. Upon admission, vitals were significant for tachycardia (98-129), soft BP, and increased respiratory rate (11-22). Negative troponins. COVID-19 negative. Chest x-ray showed no acute cardiopulmonary disease. Although patient has not had an asthma exacerbation in the last 15 years and does not take asthma medications, symptoms are likely secondary to asthma exacerbation. Reassuringly patient's symptoms improved with medications given in the ED. Chest x-ray was negative. Although patient initially tachycardia and experiencing dyspnea, PE is still low on the list of differentials as she has low risk factors including not recently being sedentary and no prior DVT or PE. Although patient on OCP with negative D-dimer reassuring. Less likely pneumonia given unremarkable CXR and non-focal findings. Patient's history, lack of sick contacts and negative COVID testing makes other viral illness less likely. Will admit patient for observation to ensure symptoms resolve.  -Admit to FPTS, attending Dr. Lum Babe  -duonebs q4h prn  -prednisone 40 mg (4 day-starting 9/24-27)  -dulera 2  puffs bid -continuous pulse ox -monitor respiratory status -goal O2 >92% -cardiac monitoring  - Continue to monitor vitals  -lovenox -PT/OT eval and treat  -up with assistance   Thrombocytosis  Platelet count 405 on admission, unsure of exact etiology. Given elevated Hgb 15.9 and hematocrit 47, concern for polycythemia vera.  -peripheral smear -may need outpatient hematology follow up   Major Depressive Disorder  Diagnosed with depression at 35 years old, previously was on medications but discontinued due to personal reasons. Last was on medications about 7 years ago. Has seen therapist in the past but not currently and is not ready for this. Recently lost husband in December 2021. States that her mood is fine now. Denies SI or HI.  - Encourage therapy outpatient - SW for PCP needs   Insomnia  At home takes nightly benadryl for sleep and allergies. Usually sleeps 4-5 hours a night.  - Melatonin 3 mg   Cocaine Use Disorder  Marijuana Use Disorder  Alcohol use Has not used cocaine or marijuana in the last 2 years, since the birth of her daughter. Cocaine route of use was nasal. Last reported drink was Sunday, drinks 1-2 beers every weekend. -monitor CIWAs -encourage continued cessation   FEN/GI: Regular diet  Prophylaxis: lovenox   Disposition: admit to med surg, attending Dr. Lum Babe   History of Present Illness:  Misty Jensen is a 35 y.o. female presenting with SOB, cough, and congestion starting on 9/21 . PMH is significant for asthma, remote cocaine and marijuana use, and major depressive. History obtained from patient. She thought she had come down with a cold 3 days ago, congestion, sinus pressure and chest tightness. Endorsing dyspnea that was relieved from nebulizer initially. Then realized she started to have asthma symptoms and began to wheeze a lot.  Started to use her breathing treatments much more often which she does not typically use. Today her breathing treatments  did not provide her with any relief and she believed she could not take it anymore so she needed to come to the ED for further evaluation. Had a lot of problems with asthma when younger but not in the last 15 years since moving to [place]. Nebulizers were initially helping and then were no longer providing relief. Denies sick contacts. No changes in activity except she was helping move out some things from an old house and moved a lot of furniture from the basement which is the only thing she can think of that may have triggered her asthma attack. Smoking, cats and seasonal changes have been previous triggers. Sometimes also exercise-induced. Feels a lot better since arriving to the hospital. Denies history of DVT or PE. Has not used her nebulizer for 15 years. Denies pain, nausea,vomiting,hematochezia, hematemesis. Endorsing decreased appetite. Has not seen a PCP in 15 years, sees OB-GYN because asthma was previously a pediatric issue. Does not see pulmonologist outpatient. Denies tobacco use, never used. Drinks 1-2 beers a week on the weekends, last drink was last Sunday. Never experienced withdrawal. History of substance use marijuana and cocaine, last used 3 years ago.   Review Of Systems: Per HPI with the following additions:   Review of Systems  Constitutional:  Negative for activity change, chills and fever.  HENT:  Positive for congestion.   Respiratory:  Positive for cough, shortness of breath and wheezing.   Cardiovascular:  Negative for chest pain.  Gastrointestinal:  Negative for diarrhea, nausea and vomiting.  Genitourinary:  Negative for hematuria.    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) 11/09/2016   MDD (major depressive disorder) 11/09/2016   Substance induced mood disorder (HCC) 06/14/2016   Severe recurrent major depression  without psychotic features (HCC) 06/12/2016    Past Medical History: Past Medical History:  Diagnosis Date   Anxiety    Asthma    Depression    Drug abuse (HCC)    THC, cocaine   Hypothyroidism    patient reports "in past"   Thyroid disease     Past Surgical History: Past Surgical History:  Procedure Laterality Date   NO PAST SURGERIES      Social History: Social History   Tobacco Use   Smoking status: Never   Smokeless tobacco: Never  Substance Use Topics   Alcohol use: Not Currently    Comment: Not in the past 3-4 years   Drug use: Yes    Types: Cocaine, Marijuana, Benzodiazepines, "Crack" cocaine, MDMA (Ecstacy)    Comment: xanax- None in pregnancy-clean for past year and a half   Please also refer to relevant sections of EMR.  Family History: Family History  Problem Relation Age of Onset   Breast cancer Maternal Grandmother     Allergies and Medications: Allergies  Allergen Reactions   Codeine Anaphylaxis   Sulfa Antibiotics Anaphylaxis   No current facility-administered medications on file prior to encounter.   Current Outpatient Medications on File Prior to Encounter  Medication Sig Dispense Refill   SLYND 4 MG TABS Take 1 tablet by mouth daily.     acetaminophen (TYLENOL) 325 MG tablet Take 2 tablets (650 mg total) by mouth every 6 (six) hours. (Patient not taking: Reported on 12/05/2020) 30 tablet  0   ibuprofen (ADVIL,MOTRIN) 600 MG tablet Take 1 tablet (600 mg total) by mouth every 6 (six) hours. (Patient not taking: Reported on 12/05/2020) 30 tablet 0   methocarbamol (ROBAXIN) 500 MG tablet Take 1 tablet (500 mg total) by mouth 2 (two) times daily. (Patient not taking: Reported on 12/05/2020) 20 tablet 0    Objective: BP 110/79   Pulse (!) 107   Temp (!) 97.4 F (36.3 C) (Oral)   Resp 20   Ht 5\' 6"  (1.676 m)   Wt 59 kg   LMP 09/30/2020 (Approximate)   SpO2 96%   BMI 20.98 kg/m  Exam: General: Patient laying comfortably in bed, in no acute  distress. Eyes: PERRLA, sclera white Neck: supple without cervical LAD, non-tender thyroid  Cardiovascular: RRR, no murmurs or gallops auscultated  Respiratory: non-focal findings noted, no evidence of respiratory distress, breathing comfortably on room air, expiratory wheezing noted diffusely  Gastrointestinal: soft, nontender, nondistended, presence of bowel sounds  MSK: no LE edema noted bilaterally, radial and distal pulses strong and equal bilaterally  Derm: skin warm and dry to touch, no rashes or lesions noted bilaterally  Neuro: A&Ox4, gross sensation, demonstrates logical thinking  Psych: mood appropriate, denies SI and HI  Labs and Imaging: CBC BMET  Recent Labs  Lab 12/05/20 1108 12/05/20 1236  WBC 8.7  --   HGB 15.9* 15.6*  HCT 47.0* 46.0  PLT 405*  --    Recent Labs  Lab 12/05/20 1108 12/05/20 1236  NA 136 139  K 4.6 4.2  CL 105 106  CO2 22  --   BUN 9 9  CREATININE 0.93 0.90  GLUCOSE 100* 90  CALCIUM 9.3  --       DG Chest 2 View  Result Date: 12/05/2020 CLINICAL DATA:  Shortness of breath and cough EXAM: CHEST - 2 VIEW COMPARISON:  Chest x-ray dated June 05, 2019 FINDINGS: The heart size and mediastinal contours are within normal limits. Both lungs are clear. The visualized skeletal structures are unremarkable. IMPRESSION: No active cardiopulmonary disease. Electronically Signed   By: June 07, 2019 M.D.   On: 12/05/2020 13:33     12/07/2020, DO 12/05/2020, 5:39 PM PGY-2, [place] [facility] FPTS Intern pager: [phone], text pages welcome

## 2020-12-06 DIAGNOSIS — J45901 Unspecified asthma with (acute) exacerbation: Secondary | ICD-10-CM | POA: Diagnosis not present

## 2020-12-06 LAB — COMPREHENSIVE METABOLIC PANEL
ALT: 29 U/L (ref 0–44)
AST: 31 U/L (ref 15–41)
Albumin: 3 g/dL — ABNORMAL LOW (ref 3.5–5.0)
Alkaline Phosphatase: 63 U/L (ref 38–126)
Anion gap: 9 (ref 5–15)
BUN: 9 mg/dL (ref 6–20)
CO2: 19 mmol/L — ABNORMAL LOW (ref 22–32)
Calcium: 8.7 mg/dL — ABNORMAL LOW (ref 8.9–10.3)
Chloride: 109 mmol/L (ref 98–111)
Creatinine, Ser: 0.84 mg/dL (ref 0.44–1.00)
GFR, Estimated: >60 mL/min (ref >60)
Glucose, Bld: 157 mg/dL — ABNORMAL HIGH (ref 70–99)
Potassium: 3.5 mmol/L (ref 3.5–5.1)
Sodium: 137 mmol/L (ref 135–145)
Total Bilirubin: 0.4 mg/dL (ref 0.3–1.2)
Total Protein: 6.6 g/dL (ref 6.5–8.1)

## 2020-12-06 LAB — CBC
HCT: 39.7 % (ref 36.0–46.0)
Hemoglobin: 13.5 g/dL (ref 12.0–15.0)
MCH: 31.2 pg (ref 26.0–34.0)
MCHC: 34 g/dL (ref 30.0–36.0)
MCV: 91.7 fL (ref 80.0–100.0)
Platelets: 352 10*3/uL (ref 150–400)
RBC: 4.33 MIL/uL (ref 3.87–5.11)
RDW: 12.1 % (ref 11.5–15.5)
WBC: 7.1 10*3/uL (ref 4.0–10.5)
nRBC: 0 % (ref 0.0–0.2)

## 2020-12-06 LAB — PATHOLOGIST SMEAR REVIEW: Path Review: UNDETERMINED

## 2020-12-06 MED ORDER — MOMETASONE FURO-FORMOTEROL FUM 100-5 MCG/ACT IN AERO: 2.0000 | INHALATION_SPRAY | Freq: Two times a day (BID) | RESPIRATORY_TRACT | 0 refills | Status: AC

## 2020-12-06 MED ORDER — PREDNISONE 20 MG PO TABS: 40.0000 mg | ORAL_TABLET | Freq: Every day | ORAL | 0 refills | Status: AC

## 2020-12-06 MED ORDER — IPRATROPIUM-ALBUTEROL 0.5-2.5 (3) MG/3ML IN SOLN: 3.0000 mL | RESPIRATORY_TRACT | 0 refills | Status: AC

## 2020-12-06 NOTE — Discharge Summary (Signed)
Family Medicine Teaching [facility] Discharge Summary  Patient name: Misty Jensen Medical record number: [id number] Date of birth: 02-17-1986 Age: 35 y.o. Gender: female Date of Admission: 12/05/2020  Date of Discharge: 12/06/2020 Admitting Physician: Reece Leader, DO  Primary Care Provider: Pcp, No Consultants: none  Indication for Hospitalization: dyspnea  Discharge Diagnoses/Problem List:  Acute respiratory distress  Major Depressive Disorder History of substance use   Disposition: home  Discharge Condition: stable  Discharge Exam:  General: Patient laying in bed comfortably, in no acute distress. CV: RRR, no murmurs or gallops auscultated Resp: CTAB, good air movement throughout all lung fields, no wheezing or rales noted Abdomen: soft, nontender, nondistended, presence of bowel sounds Ext: radial and distal pulses strong and equal bilaterally, no LE edema noted bilaterally Psych: mood appropriate, pleasant   Brief Hospital Course:  Misty Jensen is a 35 y.o. female presenting with SOB, cough, and congestion starting 3 days ago . PMH is significant for asthma, remote cocaine and marijuana use, and major depressive disorder.  Respiratory Distress likely secondary to Asthma Exacerbation Patient with prior history of asthma initially presented with 3 day history of cough, congestion and dyspnea that was intermittently resolved with home nebulizers. She then arrived to the ED after symptoms were worsening and home nebulizers were no longer benefiting her. On presentation, she was tachycardic up to the 120s, CXR was unremarable without concern for pneumonia or other infectious etiology. Tested COVID negative. Patient received three duonebs, magnesium and solumderol in the ED. Initial concern for PE given tachycardic with dyspnea but reassuringly d-dimer was negative and symptoms resolved after breathing treatments. Therefore, given all this symptoms most seem consistent with  respiratory distress secondary to asthma exacerbation. Patient was admitted for observation as she was still noted to have expiratory wheezing on exam which significantly improved. Given duonebs and dulera during hospitalization. She was transitioned to oral steroids for 4 more days for a complete 5 day course of steroid treatment. Patient remained on room air throughout the duration of this hospitalization as she was satting appropriately at >92%. Patient discharged on oral steroids and instructed to establish care with primary care physician.   Issues for follow up: Monitor respiratory status and ensure patient on appropriate breathing treatments to prevent further asthma exacerbation.  Patient with extensive history of depression and anxiety, may benefit from routine outpatient therapy, please encourage as appropriate. Administer PHQ-9 at hospital follow up. Encouraged continued cessation of illicit drug use as she has a history of cocaine and THC use although has not used in the last 2 years.   Significant Procedures:  none  Significant Labs and Imaging:  Recent Labs  Lab 12/05/20 1108 12/05/20 1236 12/06/20 0337  WBC 8.7  --  7.1  HGB 15.9* 15.6* 13.5  HCT 47.0* 46.0 39.7  PLT 405*  --  352   Recent Labs  Lab 12/05/20 1108 12/05/20 1236 12/06/20 0337  NA 136 139 137  K 4.6 4.2 3.5  CL 105 106 109  CO2 22  --  19*  GLUCOSE 100* 90 157*  BUN 9 9 9   CREATININE 0.93 0.90 0.84  CALCIUM 9.3  --  8.7*  ALKPHOS  --   --  63  AST  --   --  31  ALT  --   --  29  ALBUMIN  --   --  3.0*    No results found.   Results/Tests Pending at Time of Discharge:  none  Discharge Medications:  Allergies  as of 12/06/2020       Reactions   Codeine Anaphylaxis   Sulfa Antibiotics Anaphylaxis        Medication List     STOP taking these medications    acetaminophen 325 MG tablet Commonly known as: Tylenol   ibuprofen 600 MG tablet Commonly known as: ADVIL   methocarbamol  500 MG tablet Commonly known as: ROBAXIN   Slynd 4 MG Tabs Generic drug: Drospirenone       TAKE these medications    ipratropium-albuterol 0.5-2.5 (3) MG/3ML Soln Commonly known as: DUONEB Take 3 mLs by nebulization every 4 (four) hours.   mometasone-formoterol 100-5 MCG/ACT Aero Commonly known as: DULERA Inhale 2 puffs into the lungs 2 (two) times daily.   predniSONE 20 MG tablet Commonly known as: DELTASONE Take 2 tablets (40 mg total) by mouth daily with breakfast for 3 days. Start taking on: December 07, 2020        Discharge Instructions: Please refer to Patient Instructions section of EMR for full details.  Patient was counseled important signs and symptoms that should prompt return to medical care, changes in medications, dietary instructions, activity restrictions, and follow up appointments.   Follow-Up Appointments:  Follow-up Information     [facility] Follow up on 12/10/2020.   Why: Please go to your appointment at 1:30 pm, please arrive at least 15 minutes earlier than your scheduled appointment time. Contact information: [address] [place] [address] [phone]                Reece Leader, DO 12/06/2020, 4:03 PM PGY-2, [facility] Family Medicine

## 2020-12-06 NOTE — Hospital Course (Signed)
Misty Jensen is a 35 y.o. female presenting with SOB, cough, and congestion starting 3 days ago . PMH is significant for asthma, remote cocaine and marijuana use, and major depressive disorder.  Respiratory Distress likely secondary to Asthma Exacerbation Patient with prior history of asthma initially presented with 3 day history of cough, congestion and dyspnea that was intermittently resolved with home nebulizers. She then arrived to the ED after symptoms were worsening and home nebulizers were no longer benefiting her. On presentation, she was tachycardic up to the 120s, CXR was unremarable without concern for pneumonia or other infectious etiology. Tested COVID negative. Patient received three duonebs, magnesium and solumderol in the ED. Initial concern for PE given tachycardic with dyspnea but reassuringly d-dimer was negative and symptoms resolved after breathing treatments. Therefore, given all this symptoms most seem consistent with respiratory distress secondary to asthma exacerbation. Patient was admitted for observation as she was still noted to have expiratory wheezing on exam which significantly improved. Given duonebs and dulera during hospitalization. She was transitioned to oral steroids for 4 more days for a complete 5 day course of steroid treatment. Patient remained on room air throughout the duration of this hospitalization as she was satting appropriately at >92%. Patient discharged on oral steroids and instructed to establish care with primary care physician.   Issues for follow up Monitor respiratory status and ensure patient on appropriate breathing treatments to prevent further asthma exacerbation.  Patient with extensive history of depression and anxiety, may benefit from routine outpatient therapy, please encourage as appropriate. Administer PHQ-9 at hospital follow up. Encouraged continued cessation of illicit drug use as she has a history of cocaine and THC use although has  not used in the last 2 years.

## 2020-12-06 NOTE — Plan of Care (Signed)

## 2020-12-06 NOTE — Progress Notes (Signed)
AVS was discussed and went over with [facility]. All belongings were given back to pt.

## 2020-12-06 NOTE — Evaluation (Signed)
Occupational Therapy Evaluation Patient Details Name: Misty Jensen MRN: [id number] DOB: 1985-12-19 Today's Date: 12/06/2020   History of Present Illness 35 y.o. female presenting with SOB, cough, and congestion starting 3 days ago . PMH is significant for asthma, remote cocaine and marijuana use, and major depressive disorder.   Clinical Impression   Patient admitted for the diagnosis above.  PTA she was independent with all mobility and self care.   Essentially she endorses feeling much better, is hoping to transition home, and is at her prior level of function.  No acute OT needs identified, PT can screen for no needs, and recommend discharge home when medically stable.       Recommendations for follow up therapy are one component of a multi-disciplinary discharge planning process, led by the attending physician.  Recommendations may be updated based on patient status, additional functional criteria and insurance authorization.   Follow Up Recommendations  No OT follow up    Equipment Recommendations  None recommended by OT    Recommendations for Other Services       Precautions / Restrictions Precautions Precautions: None Restrictions Weight Bearing Restrictions: No      Mobility Bed Mobility Overal bed mobility: Independent                  Transfers Overall transfer level: Independent                    Balance Overall balance assessment: No apparent balance deficits (not formally assessed)                                         ADL either performed or assessed with clinical judgement   ADL Overall ADL's : At baseline                                             Vision Patient Visual Report: No change from baseline                  Pertinent Vitals/Pain Pain Assessment: No/denies pain     Hand Dominance Right   Extremity/Trunk Assessment Upper Extremity Assessment Upper Extremity Assessment:  Overall WFL for tasks assessed   Lower Extremity Assessment Lower Extremity Assessment: Overall WFL for tasks assessed   Cervical / Trunk Assessment Cervical / Trunk Assessment: Normal   Communication Communication Communication: No difficulties   Cognition Arousal/Alertness: Awake/alert Behavior During Therapy: WFL for tasks assessed/performed Overall Cognitive Status: Within Functional Limits for tasks assessed                                                      Home Living Family/patient expects to be discharged to:: Private residence Living Arrangements: Parent Available Help at Discharge: Family;Available 24 hours/day Type of Home: House Home Access: Stairs to enter [employer] of Steps: 3   Home Layout: Two level;Bed/bath upstairs Alternate Level Stairs-Number of Steps: 14 Alternate Level Stairs-Rails: Left Bathroom Shower/Tub: Tub/shower unit;Walk-in shower   Bathroom Toilet: Standard     Home Equipment: None          Prior Functioning/Environment Level of  Independence: Independent                 OT Problem List: Decreased activity tolerance      OT Treatment/Interventions:      OT Goals(Current goals can be found in the care plan section) Acute Rehab OT Goals Patient Stated Goal: hoping to go home today OT Goal Formulation: With patient Time For Goal Achievement: 12/06/20 Potential to Achieve Goals: Good  OT Frequency:     Barriers to D/C:  None noted.  States she is returning home to her mother's          Co-evaluation              AM-PAC OT "6 Clicks" Daily Activity     Outcome Measure Help from another person eating meals?: None Help from another person taking care of personal grooming?: None Help from another person toileting, which includes using toliet, bedpan, or urinal?: None Help from another person bathing (including washing, rinsing, drying)?: None Help from another person to put on and  taking off regular upper body clothing?: None Help from another person to put on and taking off regular lower body clothing?: None 6 Click Score: 24   End of Session Nurse Communication: Mobility status  Activity Tolerance: Patient tolerated treatment well Patient left: in bed;with call bell/phone within reach  OT Visit Diagnosis: Other (comment) (respiratory distress)                Time: 7893-8101 OT Time Calculation (min): 12 min Charges:  OT General Charges $OT Visit: 1 Visit OT Evaluation $OT Eval Moderate Complexity: 1 Mod  12/06/2020  RP, OTR/L  Acute Rehabilitation Services  Office:  [phone]   Suzanna Obey 12/06/2020, 11:37 AM

## 2020-12-06 NOTE — Progress Notes (Signed)
FPTS Brief Progress Note  S:Patient sleeping comfortably in bed.    O: BP 113/80 (BP Location: Left Arm)   Pulse (!) 113   Temp 99 F (37.2 C) (Oral)   Resp 16   Ht 5\' 6"  (1.676 m)   Wt 59 kg   LMP 09/30/2020 (Approximate)   SpO2 98%   BMI 20.98 kg/m     A/P: - Orders reviewed. Labs for AM ordered, which was adjusted as needed.    10/02/2020, MD 12/06/2020, 1:03 AM PGY-1, 12/08/2020 Health Family Medicine Night Resident  Please page [phone] with questions.

## 2020-12-06 NOTE — Progress Notes (Signed)
PT Cancellation Note  Patient Details Name: Misty Jensen MRN: [id number] DOB: September 21, 1985   Cancelled Treatment:    Reason Eval/Treat Not Completed: PT screened, no needs identified, will sign off (Ambulating without assist or device. Pt is at baseline.)   Misty Jensen 12/06/2020, 1:29 PM Misty Jensen,PT Acute Rehab Services [phone] (pager)

## 2020-12-06 NOTE — Care Management (Signed)
Confirmed with patient that she has a home nebulizer

## 2020-12-06 NOTE — Discharge Instructions (Addendum)
You were hospitalized at [facility] due to shortness of breath.  We expect this is from an asthma exacerbation which improved after breathing treatments and steroids.  We are so glad you are feeling better.  Please take all your medications as prescribed, take prednisone 40 mg daily for 3 more days. Be sure to follow-up with your regularly scheduled appointments. Please also be sure to follow-up with our clinic on 9/28 at 1:30 pm.  Thank you for allowing [place] to be a part of your medical care.  Take care, [facility] family medicine team

## 2020-12-06 NOTE — Progress Notes (Signed)
Patient denies any SOB or chest tightness. Feel almost at her baseline.  On exam she is mildly tachycardia with improved expiratory wheeze.  A/P: Asthma exacerbation: Improved Continue albuterol prn and Dulera 2 puffs BID Complete course of oral steroids. F/U with [facility] in the next 2 - 3 days. Return to the ED if symptoms return.  Tachycardia and tremors No tremors today Likely due to [employer] out albuterol prn now that she is close to her baseline F/U with PCP for reassessment.

## 2020-12-09 LAB — PATHOLOGIST SMEAR REVIEW: Path Review: BORDERLINE

## 2020-12-10 ENCOUNTER — Other Ambulatory Visit: Payer: Self-pay

## 2020-12-10 ENCOUNTER — Ambulatory Visit (INDEPENDENT_AMBULATORY_CARE_PROVIDER_SITE_OTHER): Payer: Medicaid Other | Admitting: Family Medicine

## 2020-12-10 VITALS — BP 130/86 | HR 111 | Ht 66.0 in | Wt 141.2 lb

## 2020-12-10 DIAGNOSIS — Z09 Encounter for follow-up examination after completed treatment for conditions other than malignant neoplasm: Secondary | ICD-10-CM | POA: Diagnosis present

## 2020-12-10 DIAGNOSIS — R000 Tachycardia, unspecified: Secondary | ICD-10-CM | POA: Diagnosis not present

## 2020-12-10 DIAGNOSIS — Z23 Encounter for immunization: Secondary | ICD-10-CM | POA: Diagnosis not present

## 2020-12-10 NOTE — Patient Instructions (Addendum)
Thank you for coming to see me today. It was a pleasure.   Continue Dulera 2 puffs twice a day Continue Duonebs every 6 hours for 24 hours, then  every 4-6 hours as needed for shortness of breath.  If worsening shortness of breath please call clinic or go to Urgent care or Emergency department for evaluation  Please follow-up with PC to establish care in 1-2 week  If you have any questions or concerns, please do not hesitate to call the office at [phone].  Best,   Dana Allan, MD

## 2020-12-10 NOTE — Progress Notes (Signed)
    SUBJECTIVE:   CHIEF COMPLAINT / HPI:   Patient admitted to hospital between 09/23 and 09/24 and treated for Respiratory distress secondary to Asthma Exacerbation. Discharged on 09/24. Presents today for follow up. Reports improvement in breathing. Compliant with Dulera 2 puffs twice a day and using Duonebs every 6 hours.  Completed 5 days of Prednisone, last dose 09/27.  Last Asthma flare up > 15 years ago.  Reports multiple allergies to animals [place].  Previously seen Pulmonologist and Allergist years ago.     PERTINENT  PMH / PSH:  Asthma  OBJECTIVE:   BP 130/86   Pulse (!) 111   Ht 5\' 6"  (1.676 m)   Wt 141 lb 4 oz (64.1 kg)   SpO2 100%   BMI 22.80 kg/m    General: Alert, no acute distress Cardio: Tachycardic no r/m/g Pulm: expiratory wheezing throughout lung fields,  normal work of breathing Extremities: No peripheral edema.   ASSESSMENT/PLAN:   Hospital discharge follow-up Improvement in asthma exacerbation. Breathing better with Dulera and Duonebs.Exam notable for expiratory wheezing but good air entry bilaterally and no IWOB. -Continue Dulera 2 puffs BID -Continue Duonebs q6h x 24 hours -Avoid triggers -Flu Vaccine today -Reports 2 COVID vaccines recieved -Follow up with PCP 1-2 weeks or sooner if symptoms worsen.  Tachycardia Reports increased HR for years.  TSH in 2016 wnl.  Patient reports was evaluated by Endocrinology.  Also noted to have had Duoneb treatment prior to visit.  Denies any chest pain or worsening shortness of breath -Request for medical records -Follow up with PCP to establish care in 1-2 weeks     2017, MD [facility]

## 2020-12-10 NOTE — Assessment & Plan Note (Addendum)
Improvement in asthma exacerbation. Breathing better with Dulera and Duonebs.Exam notable for expiratory wheezing but good air entry bilaterally and no IWOB. -Continue Dulera 2 puffs BID -Continue Duonebs q6h x 24 hours -Avoid triggers -Flu Vaccine today -Reports 2 COVID vaccines recieved -Follow up with PCP 1-2 weeks or sooner if symptoms worsen.

## 2020-12-10 NOTE — Assessment & Plan Note (Signed)
Reports increased HR for years.  TSH in 2016 wnl.  Patient reports was evaluated by Endocrinology.  Also noted to have had Duoneb treatment prior to visit.  Denies any chest pain or worsening shortness of breath -Request for medical records -Follow up with PCP to establish care in 1-2 weeks
